# Patient Record
Sex: Female | Born: 1985 | Hispanic: Yes
Health system: Southern US, Community
[De-identification: ages and names within clinical notes are randomized; demographics above are authoritative.]

## PROBLEM LIST (undated history)

## (undated) ENCOUNTER — Inpatient Hospital Stay (HOSPITAL_COMMUNITY): Payer: Self-pay

## (undated) DIAGNOSIS — Z789 Other specified health status: Secondary | ICD-10-CM

## (undated) HISTORY — PX: APPENDECTOMY: SHX54

---
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
## 2006-07-23 ENCOUNTER — Emergency Department (HOSPITAL_COMMUNITY): Admission: EM | Admit: 2006-07-23 | Discharge: 2006-07-23 | Payer: Self-pay | Admitting: Emergency Medicine

## 2006-09-26 LAB — CONVERTED CEMR LAB: Pap Smear: NORMAL

## 2006-10-06 ENCOUNTER — Ambulatory Visit: Payer: Self-pay | Admitting: Obstetrics & Gynecology

## 2006-10-27 ENCOUNTER — Ambulatory Visit: Payer: Self-pay | Admitting: Gynecology

## 2006-11-24 ENCOUNTER — Ambulatory Visit: Payer: Self-pay | Admitting: Obstetrics and Gynecology

## 2007-07-20 ENCOUNTER — Ambulatory Visit: Payer: Self-pay | Admitting: Nurse Practitioner

## 2007-07-20 DIAGNOSIS — K5900 Constipation, unspecified: Secondary | ICD-10-CM | POA: Insufficient documentation

## 2007-07-20 DIAGNOSIS — M545 Low back pain: Secondary | ICD-10-CM

## 2007-07-20 LAB — CONVERTED CEMR LAB
ALT: 23 units/L (ref 0–35)
AST: 18 units/L (ref 0–37)
BUN: 10 mg/dL (ref 6–23)
Basophils Absolute: 0 10*3/uL (ref 0.0–0.1)
Bilirubin Urine: NEGATIVE
CO2: 22 meq/L (ref 19–32)
Creatinine, Ser: 0.59 mg/dL (ref 0.40–1.20)
Eosinophils Absolute: 0.3 10*3/uL (ref 0.0–0.7)
Eosinophils Relative: 5 % (ref 0–5)
Lymphocytes Relative: 35 % (ref 12–46)
Lymphs Abs: 2 10*3/uL (ref 0.7–4.0)
MCHC: 30.9 g/dL (ref 30.0–36.0)
Nitrite: NEGATIVE
Platelets: 290 10*3/uL (ref 150–400)
Protein, U semiquant: NEGATIVE
RBC: 4.58 M/uL (ref 3.87–5.11)
RDW: 14.8 % (ref 11.5–15.5)
Specific Gravity, Urine: 1.02
Total Bilirubin: 0.3 mg/dL (ref 0.3–1.2)
WBC: 5.8 10*3/uL (ref 4.0–10.5)
pH: 6

## 2007-07-21 ENCOUNTER — Encounter (INDEPENDENT_AMBULATORY_CARE_PROVIDER_SITE_OTHER): Payer: Self-pay | Admitting: Nurse Practitioner

## 2007-07-22 ENCOUNTER — Ambulatory Visit: Payer: Self-pay | Admitting: *Deleted

## 2007-08-12 ENCOUNTER — Ambulatory Visit: Payer: Self-pay | Admitting: Nurse Practitioner

## 2007-08-12 DIAGNOSIS — R0602 Shortness of breath: Secondary | ICD-10-CM

## 2007-08-12 DIAGNOSIS — J309 Allergic rhinitis, unspecified: Secondary | ICD-10-CM | POA: Insufficient documentation

## 2007-08-12 LAB — CONVERTED CEMR LAB
Blood in Urine, dipstick: NEGATIVE
Protein, U semiquant: NEGATIVE
Urobilinogen, UA: 0.2
pH: 5.5

## 2007-08-22 ENCOUNTER — Ambulatory Visit (HOSPITAL_COMMUNITY): Admission: RE | Admit: 2007-08-22 | Discharge: 2007-08-22 | Payer: Self-pay | Admitting: Family Medicine

## 2007-08-29 ENCOUNTER — Telehealth (INDEPENDENT_AMBULATORY_CARE_PROVIDER_SITE_OTHER): Payer: Self-pay | Admitting: *Deleted

## 2007-09-14 ENCOUNTER — Ambulatory Visit: Payer: Self-pay | Admitting: Nurse Practitioner

## 2007-09-14 DIAGNOSIS — J029 Acute pharyngitis, unspecified: Secondary | ICD-10-CM

## 2007-09-16 ENCOUNTER — Encounter (INDEPENDENT_AMBULATORY_CARE_PROVIDER_SITE_OTHER): Payer: Self-pay | Admitting: Nurse Practitioner

## 2007-09-23 ENCOUNTER — Ambulatory Visit: Payer: Self-pay | Admitting: Internal Medicine

## 2007-09-23 DIAGNOSIS — F329 Major depressive disorder, single episode, unspecified: Secondary | ICD-10-CM | POA: Insufficient documentation

## 2007-09-23 DIAGNOSIS — F429 Obsessive-compulsive disorder, unspecified: Secondary | ICD-10-CM | POA: Insufficient documentation

## 2007-09-23 DIAGNOSIS — J019 Acute sinusitis, unspecified: Secondary | ICD-10-CM

## 2007-10-07 ENCOUNTER — Telehealth (INDEPENDENT_AMBULATORY_CARE_PROVIDER_SITE_OTHER): Payer: Self-pay | Admitting: *Deleted

## 2007-10-18 ENCOUNTER — Ambulatory Visit: Payer: Self-pay | Admitting: Internal Medicine

## 2007-10-18 DIAGNOSIS — B373 Candidiasis of vulva and vagina: Secondary | ICD-10-CM

## 2007-10-18 DIAGNOSIS — B3731 Acute candidiasis of vulva and vagina: Secondary | ICD-10-CM | POA: Insufficient documentation

## 2007-10-18 LAB — CONVERTED CEMR LAB: KOH Prep: NEGATIVE

## 2007-11-14 ENCOUNTER — Emergency Department (HOSPITAL_COMMUNITY): Admission: EM | Admit: 2007-11-14 | Discharge: 2007-11-15 | Payer: Self-pay | Admitting: Emergency Medicine

## 2008-03-08 ENCOUNTER — Emergency Department (HOSPITAL_COMMUNITY): Admission: EM | Admit: 2008-03-08 | Discharge: 2008-03-08 | Payer: Self-pay | Admitting: Emergency Medicine

## 2009-10-15 ENCOUNTER — Telehealth (INDEPENDENT_AMBULATORY_CARE_PROVIDER_SITE_OTHER): Payer: Self-pay | Admitting: Internal Medicine

## 2009-10-15 ENCOUNTER — Ambulatory Visit: Payer: Self-pay | Admitting: Internal Medicine

## 2009-10-16 ENCOUNTER — Encounter (INDEPENDENT_AMBULATORY_CARE_PROVIDER_SITE_OTHER): Payer: Self-pay | Admitting: Internal Medicine

## 2009-10-16 ENCOUNTER — Telehealth (INDEPENDENT_AMBULATORY_CARE_PROVIDER_SITE_OTHER): Payer: Self-pay | Admitting: Internal Medicine

## 2009-10-23 ENCOUNTER — Telehealth (INDEPENDENT_AMBULATORY_CARE_PROVIDER_SITE_OTHER): Payer: Self-pay | Admitting: Nurse Practitioner

## 2009-10-27 ENCOUNTER — Ambulatory Visit: Payer: Self-pay | Admitting: Nurse Practitioner

## 2009-10-27 ENCOUNTER — Inpatient Hospital Stay (HOSPITAL_COMMUNITY): Admission: AD | Admit: 2009-10-27 | Discharge: 2009-10-28 | Payer: Self-pay | Admitting: Family Medicine

## 2009-10-30 ENCOUNTER — Ambulatory Visit: Payer: Self-pay | Admitting: Nurse Practitioner

## 2009-10-31 ENCOUNTER — Encounter (INDEPENDENT_AMBULATORY_CARE_PROVIDER_SITE_OTHER): Payer: Self-pay | Admitting: Nurse Practitioner

## 2009-11-04 ENCOUNTER — Telehealth (INDEPENDENT_AMBULATORY_CARE_PROVIDER_SITE_OTHER): Payer: Self-pay | Admitting: Nurse Practitioner

## 2009-11-06 ENCOUNTER — Inpatient Hospital Stay (HOSPITAL_COMMUNITY): Admission: EM | Admit: 2009-11-06 | Discharge: 2009-11-11 | Payer: Self-pay | Admitting: Emergency Medicine

## 2009-11-07 ENCOUNTER — Ambulatory Visit: Payer: Self-pay | Admitting: Internal Medicine

## 2009-12-04 ENCOUNTER — Ambulatory Visit: Payer: Self-pay | Admitting: Nurse Practitioner

## 2009-12-04 DIAGNOSIS — K219 Gastro-esophageal reflux disease without esophagitis: Secondary | ICD-10-CM | POA: Insufficient documentation

## 2009-12-12 ENCOUNTER — Telehealth (INDEPENDENT_AMBULATORY_CARE_PROVIDER_SITE_OTHER): Payer: Self-pay | Admitting: Nurse Practitioner

## 2009-12-13 ENCOUNTER — Encounter (INDEPENDENT_AMBULATORY_CARE_PROVIDER_SITE_OTHER): Payer: Self-pay | Admitting: Internal Medicine

## 2009-12-18 ENCOUNTER — Ambulatory Visit: Payer: Self-pay | Admitting: Nurse Practitioner

## 2009-12-18 DIAGNOSIS — M549 Dorsalgia, unspecified: Secondary | ICD-10-CM | POA: Insufficient documentation

## 2009-12-24 ENCOUNTER — Telehealth (INDEPENDENT_AMBULATORY_CARE_PROVIDER_SITE_OTHER): Payer: Self-pay | Admitting: *Deleted

## 2010-01-23 ENCOUNTER — Ambulatory Visit: Payer: Self-pay | Admitting: Internal Medicine

## 2010-02-17 ENCOUNTER — Inpatient Hospital Stay (HOSPITAL_COMMUNITY): Admission: AD | Admit: 2010-02-17 | Discharge: 2010-02-18 | Payer: Self-pay | Admitting: Obstetrics & Gynecology

## 2010-02-17 ENCOUNTER — Ambulatory Visit: Payer: Self-pay | Admitting: Family

## 2010-02-21 ENCOUNTER — Emergency Department (HOSPITAL_COMMUNITY): Admission: EM | Admit: 2010-02-21 | Discharge: 2010-02-21 | Payer: Self-pay | Admitting: Emergency Medicine

## 2010-02-25 ENCOUNTER — Telehealth (INDEPENDENT_AMBULATORY_CARE_PROVIDER_SITE_OTHER): Payer: Self-pay | Admitting: *Deleted

## 2010-05-27 NOTE — Progress Notes (Signed)
Summary: Stool results  Phone Note Call from Patient   Summary of Call: contact pt and see if diarrhea has resolved or at least decreased let her know that stool samples were ok. no bacteria  Initial call taken by: Lehman Prom FNP,  November 04, 2009 7:54 AM  Follow-up for Phone Call        pt informed of above information. Follow-up by: Levon Hedger,  November 04, 2009 12:12 PM

## 2010-05-27 NOTE — Progress Notes (Signed)
Summary: Back Pain  Phone Note Call from Patient Call back at Home Phone [phone] Call back at [phone]   Summary of Call: the pt is having pain  since last week on the right side of the upper section of her back.  Pt was looking for an appointment for today but unfortunately we do not have anything availablle. Mulberry MD Initial call taken by: Manon Hilding,  December 12, 2009 2:21 PM  Follow-up for Phone Call        Left message on answering machine for pt. to return call.  Dutch Quint RN  December 12, 2009 4:24 PM  Left message on answering machine for pt. to return call.  Dutch Quint RN  December 13, 2009 11:34 AM  Via interpreter, States feels like a needle in her right upper back; in the front, feels pain under her ribs.  Last rib hurts when she's laughing or when she takes a deep breath.  No trauma,  feels like her front and back are touching.  States she has an itchy throat; yesterday and day before she was sneezing a lot.  Denies cough or fever.  When she has a bowel movement, is passing bright red blood since yesterday, small amount. Denies knowledge of hemorrhoids.  Advised of no openings today, has appt. on 8/24.  If symptoms worsen significantly,  to go to urgent care.  Verbalized understanding. Follow-up by: Dutch Quint RN,  December 13, 2009 2:30 PM

## 2010-05-27 NOTE — Progress Notes (Signed)
Summary: PROBLEM WITH ITCHING   Phone Note Call from Patient   Summary of Call: PATIENT IS CALLING TO SEE IF YOU CAN GINE SOME MEDICINE HE IS HAVING REAL BAD ITCHING ON HER ANUS SHE CAN'T SLEEP [facility] AND SHE SAID THEY CAN'T FIND WHAT IT IS. PLEASE CALL HER AT [phone] SHE HAS APP TO SEE YOU ON 12.01. AT 2:45 PM Initial call taken by: Domenic Polite,  February 25, 2010 1:54 PM  Follow-up for Phone Call        This should go to CMA to obtain more information please. Has she been on antibiotics?  Any vaginal discharge?  Any redness of area?  Diarrhea recently?  What preceded the itching?   If we have a cancellation, try to work her in as well. Follow-up by: Julieanne Manson MD,  February 27, 2010 2:30 PM  Additional Follow-up for Phone Call Additional follow up Details #1::        pt says she went to [facility] and they referred her to a dermatologist and the derma gave her a cream and pt has a f/u on wed. Additional Follow-up by: Arm[place] Shannon,  February 28, 2010 8:43 AM

## 2010-05-27 NOTE — Progress Notes (Signed)
Summary: MEDICATION MAKE HEART BEAT FAST  Phone Note Call from Patient Call back at Home Phone [phone]   Summary of Call: MULBERRY PT. Shelby Morales SAYS THAT THE MEDICATION THAT SHE TAKES DEXELANT IS CAUSING HER HERAT TO BEAT FAST, HER BACK HAS A BURNING SENSATION AND SHE STOPPED TAKING IT 2 DAYS AGO. SHE HAS ONLY HAD 2 DOSES OF THIS MED. Initial call taken by: Leodis Rains,  December 24, 2009 3:14 PM  Follow-up for Phone Call        Sent to E. Mulberry.  Dutch Quint RN  December 24, 2009 3:20 PM   Additional Follow-up for Phone Call Additional follow up Details #1::        Stop the Dexilant Will call in another medication--generic for pepcid--to take daily at bedtime Additional Follow-up by: Julieanne Manson MD,  December 25, 2009 8:43 AM    Additional Follow-up for Phone Call Additional follow up Details #2::    Left message with pt.'s husband for pt. to return call.  Dutch Quint RN  December 25, 2009 10:46 AM  Pt. at work -- pt. husband has appt. today, will bring wife with him to get information.  Dutch Quint RN  December 26, 2009 12:05 PM  I spoke with pt today and she is aware of the change of medicine   Follow-up by: Cheryll Dessert,  December 31, 2009 8:48 AM  New/Updated Medications: FAMOTIDINE 40 MG TABS (FAMOTIDINE) 1 tab by mouth at bedtime Prescriptions: FAMOTIDINE 40 MG TABS (FAMOTIDINE) 1 tab by mouth at bedtime  #30 x 2   Entered and Authorized by:   Julieanne Manson MD   Signed by:   Julieanne Manson MD on 12/25/2009   Method used:   Faxed to ...       [facility] (retail)       [address] [place], [place]  [address]       Ph: [phone]       Fax: [phone]   RxID:   [id number]

## 2010-05-27 NOTE — Letter (Signed)
Summary: Handout Printed  Printed Handout:  - Gastroesophageal Reflux Disease (GERD) 

## 2010-05-27 NOTE — Assessment & Plan Note (Signed)
Summary: Back pain   Vital Signs:  Patient profile:   25 year old female LMP:     11/20/2009 Weight:      163.8 pounds Temp:     97.8112 degrees F oral Pulse rate:   71 / minute Pulse rhythm:   regular Resp:     16 per minute BP sitting:   112 / 68  (left arm) Cuff size:   regular  Vitals Entered By: Levon Hedger (December 18, 2009 11:13 AM) CC: having alot of pain in middle of back she feels pressure in that area...2 weeks ago she had more pain in the right side of her back...she says that she has problem when she makes a bowel movement Is Patient Diabetic? No Pain Assessment Patient in pain? yes     Location: back Intensity: 5-6  Does patient need assistance? Functional Status Self care Ambulation Normal LMP (date): 11/20/2009 LMP - Character: normal     Enter LMP: 11/20/2009 Last PAP Result historical - normal   CC:  having alot of pain in middle of back she feels pressure in that area...2 weeks ago she had more pain in the right side of her back...she says that she has problem when she makes a bowel movement.  History of Present Illness:  Pt into the office for pain in the middle of her back present for the past 2 weeks Pain has been increasing over the past 2 weeks. currently feels like someone has a needle and is sticking inside of the middle part of her back Pt used some "hot cream" which alleviated the pain but it returned again the next day.  She only used the cream one time. no other over the counter meds for the pain  Pt recalls that on the days when she had the pain stronger she took clothes to the laundry. She also moved about 1.5 months ago to a new apartment.  In house interpreter present to speak Spanish  **Pt called to the front desk while in the room and asked how long it would be before provider comes into the room to see her** Pt also has lots of questions about her exposure to mold and if this will be an ongoing problem for her breathng in the  future.  Again, pt has since moved from the environment after living there for 5 months.    Allergies (verified): 1)  ! Asa 2)  ! Ibuprofen  Review of Systems CV:  Denies chest pain or discomfort. Resp:  Denies cough. GI:  Complains of nausea; denies abdominal pain, constipation, and vomiting; BM now about 3 times per  c/o nausea at time when she eats.  pt was prescribed ranitidine during last visit but pt is not taking as ordered.  She reports that the medication made her sick so she is not taking.  Gallbladder is still in place.. MS:  Complains of mid back pain.  Physical Exam  General:  alert.   Head:  normocephalic.   Lungs:  normal breath sounds.   Heart:  normal rate and regular rhythm.   Abdomen:  normal bowel sounds.   Msk:  normal ROM.   Neurologic:  alert & oriented X3.   Skin:  color normal.   Psych:  Oriented X3.     Impression & Recommendations:  Problem # 1:  BACK PAIN (ICD-724.5) ? due to muscle strain from moving or ? gallbladder problems as this is still in place will advise pt to use topical cream  for back - no oral meds except tylenol as this may irritate stomach.  Problem # 2:  GERD (ICD-530.81) pt did NOT take ranitidine as ordered will give samples of dexilant and advised pt to take DAILY The following medications were removed from the medication list:    Ranitidine Hcl 150 Mg Tabs (Ranitidine hcl) ..... One tablet by mouth two times a day for stomach Her updated medication list for this problem includes:    Dexilant 60 Mg Cpdr (Dexlansoprazole) ..... One tablet daily before breakfast  Complete Medication List: 1)  Flora-q Caps (Probiotic product) .... One tablet by mouth daily 2)  Dexilant 60 Mg Cpdr (Dexlansoprazole) .... One tablet daily before breakfast  Anticoagulation Management Assessment/Plan:            Patient Instructions: 1)  Follow up in 2 weeks with Mulberry for back and stomach problems 2)  Take medications DAILY  before  breakfast Prescriptions: DEXILANT 60 MG CPDR (DEXLANSOPRAZOLE) One tablet daily before breakfast  #15 x 0   Entered and Authorized by:   Lehman Prom FNP   Signed by:   Lehman Prom FNP on 12/18/2009   Method used:   Samples Given   RxID:   [id number]

## 2010-05-27 NOTE — Progress Notes (Signed)
Summary: Side Effect from the medication/ Please call her back  Phone Note Call from Patient Call back at [phone]   Summary of Call: The pt start having side effects from the medication (Azithromycin)  (diarrhea for two days, itching rectum area), loss appetite and throat itching too, the pt also has blood spots in both eyes.   Pt is wondering if should she continue  taking the medication (pt still has one pending pill).  [facility]) Delrae Alfred MD Initial call taken by: Manon Hilding,  October 23, 2009 9:37 AM  Follow-up for Phone Call        Left message on answering machine for pt to return call at [phone]. Follow-up by: Vesta Mixer CMA,  October 23, 2009 4:28 PM  Additional Follow-up for Phone Call Additional follow up Details #1::        Pt returned called and said she has taken the antibx with food.  She has not yet taken the last pill.  Her symptoms started Monday and she started the antibx Friday.  She is having diarrhea, itchy throat, cough and some nausea. Additional Follow-up by: Vesta Mixer CMA,  October 23, 2009 4:33 PM    Additional Follow-up for Phone Call Additional follow up Details #2::    The night before yesterday, the pt took imodium which help her to stop the diarrhea but now she has abdominal pain.  The pt have not been eating anything so her stomach is empty because she doesn't feel hungry.  The pt feels very weak; hardly for her keep standing  up.[facility] or [employer]. Manon Hilding  October 25, 2009 12:01 PM  Do not take any more of the medicine.  If she is still having abdominal problems/diarrhea next week to call and get seen at beginning of week.  Follow-up by: Julieanne Manson MD,  October 25, 2009 2:24 PM  Additional Follow-up for Phone Call Additional follow up Details #3:: Details for Additional Follow-up Action Taken: Pt did not receive the above information pt went to the ER on 10/27/2009 here today for f/u visit Additional  Follow-up by: Lehman Prom FNP,  October 30, 2009 11:29 AM

## 2010-05-27 NOTE — Assessment & Plan Note (Signed)
Summary: Acute - Diarrhea   Vital Signs:  Patient profile:   25 year old female LMP:     10/21/2009 Weight:      163.7 pounds BMI:     26.32 BSA:     1.84 Temp:     98.0 degrees F oral Pulse rhythm:   regular BP sitting:   102 / 70  (left arm) Cuff size:   regular  Vitals Entered By: Levon Hedger (October 30, 2009 10:36 AM) CC: stomach pain...leg numbness and weakness, Diarrhea Is Patient Diabetic? No Pain Assessment Patient in pain? yes     Location: stomach Intensity: 8-9  Does patient need assistance? Functional Status Self care Ambulation Normal LMP (date): 10/21/2009 LMP - Character: normal     Enter LMP: 10/21/2009 Last PAP Result historical - normal   CC:  stomach pain...leg numbness and weakness and Diarrhea.  History of Present Illness:  Pt into the office to f/u on a ER visit on 10/27/2009 (full Er visit reviewed) Dx: Gastroenteritis; pt was given IV hydration and antimetics U/A normal CMET normal CBC ok wet prep normal  Diarrhea      This is a 25 year old woman who presents with Diarrhea.  The symptoms began 1 week ago.  The intensity is described as moderate.  Pt was started on antibiotics following her last here with Dr. Delrae Alfred.  She took the antibiotics for 4 days before diarrhea a started (she has 1 pill remaining).  The patient complains of watery/unformed stools, but denies blood in stool.  Associated symptoms include abdominal pain and nausea.  2 loose stools today before visit in this office. The patient denies fever and vomiting.  The symptoms are worse with any food.  Patient's risk factors for diarrhea include recent antibiotic use.   Husband with abdominal pain but no diarrhea Toddler child with no GI symptoms Denies any recent travel Pt took imodium for GI symptoms without resolution. 2 pounds weight loss since checked here last on 10/15/2009 Pt has ranitadine 150mg  by mouth that she has been taking twice per day from the ER. She also has  phenergan that she is taking as needed (makes her have headache) She has been drinking liquids including Gatorade, Mt. dew, and water  Spanish interpreter present in office today with pt   Allergies (verified): 1)  ! Asa  Review of Systems General:  Denies fever. CV:  Denies chest pain or discomfort. Resp:  Denies cough. GI:  Complains of diarrhea; denies vomiting.  Physical Exam  General:  alert.   Head:  normocephalic.   Lungs:  normal breath sounds.   Heart:  normal rate and regular rhythm.   Abdomen:  BS x 4 soft.   left upper quadrant tenderness   Impression & Recommendations:  Problem # 1:  GASTROENTERITIS (ICD-558.9)  advsied pt of bland diet will send containers for pt to collect stool and return to this office continue to take ranitidine will order lomotil as needed since symptoms have been ongoing for over 1  week no fever Orders: T-Clostridium difficile Toxin A/B ([id number]) T-Stool Culture ([id number])  Her updated medication list for this problem includes:    Lomotil 2.5-0.025 Mg Tabs (Diphenoxylate-atropine) ..... One tablet by mouth two times per day for diarrhea  Complete Medication List: 1)  Peri-colace 8.6-50 Mg Tabs (Sennosides-docusate sodium) .Marland Kitchen.. 1 tablet by mouth two times a day 2)  Fluticasone Propionate 50 Mcg/act Susp (Fluticasone propionate) .... 2 sprays each nostril daily 3)  Zoloft  50 Mg Tabs (Sertraline hcl) .Marland Kitchen.. 1 tab by mouth daily 4)  Zyrtec Allergy 10 Mg Tabs (Cetirizine hcl) .Marland Kitchen.. 1 tab by mouth daily 5)  Lomotil 2.5-0.025 Mg Tabs (Diphenoxylate-atropine) .... One tablet by mouth two times per day for diarrhea 6)  Ranitidine Hcl 150 Mg Tabs (Ranitidine hcl) .... One tablet by mouth two times a day  Patient Instructions: 1)  Bring stools samples back to this office so it can be tested for infection. 2)  You will be notified of the results 3)  Take Ranitidine 150mg  by mouth two times a day  4)  May also take lomotil as needed for  diarrhea 5)  Bland diet - Bananas, Bread, rice, applesauce, chicken noodle soup, jello,  6)  Avoid spicy foods, tomato sauces, gravy and caffiene until diarrhea improves 7)  Follow up as needed if symptoms continue or worsen Prescriptions: LOMOTIL 2.5-0.025 MG TABS (DIPHENOXYLATE-ATROPINE) One tablet by mouth two times per day for diarrhea  #20 x 0   Entered and Authorized by:   Lehman Prom FNP   Signed by:   Lehman Prom FNP on 10/30/2009   Method used:   Print then Give to Patient   RxID:   [id number]

## 2010-05-27 NOTE — Letter (Signed)
Summary: Generic Letter  [facility]  [address] [place], [place] [address]   Phone: [phone]  Fax: [phone]    10/16/2009  [facility]    Re:  MYRA WENG [facility]      [address] [place], [place]  [address]  To Whom It May Concern:  Mr. Leonette Monarch and Ms. Fortino Sic are patients of our clinic who presented on 10/15/09 with symptoms of bloody noses, congestion, cough, sinus burning and shortness of breath/chest discomfort for the past month or so.  Their young daughter has also had a bloody nose recently as well.  They showed me pictures of their apartment with significant growth of mold on their bedroom carpet--generally associated with furniture placement, furniture, and baseboards.  Apparently, the wall at about 2 feet above the floor had mold growth behind a piece of furniture as well.  Certainly, this mold could be the source of their symptoms and I am concerned for their health.    Their landlord apparently has removed the carpet and was treating the floor beneath with an unknown chemical and the family is happy that that is being addressed.  The main concern at this point, however, is that the apartment be evaluated for mold in relation to the walls.  It is not clear to me or the family as to whether the mold was mainly related to the furniture and the carpet/floor or whether the wall could be more involved or not.  If the walls cannot be adequately assessed or addressed for this, the family is just asking whether they can break their lease without a fine  to find other accomodations.   Please let me know if you need further information to help in the situation          Sincerely,   Julieanne Manson MD

## 2010-05-27 NOTE — Assessment & Plan Note (Signed)
Summary: F/U ON PAIN IN BACK /LR   Vital Signs:  Patient profile:   25 year old female LMP:     01/20/2010 Weight:      161.4 pounds Temp:     98.8 degrees F oral Pulse rate:   84 / minute Pulse rhythm:   regular Resp:     16 per minute BP sitting:   110 / 76  (left arm)  Vitals Entered By: Michelle Nasuti (January 23, 2010 3:51 PM) CC: follow-up visit  LMP (date): 01/20/2010 LMP - Character: normal     Enter LMP: 01/20/2010 Last PAP Result historical - normal   CC:  follow-up visit .  History of Present Illness: 1.  Back pain:  Comes and goes for about 1 1/2 months now.  Still very painful at times.  See previous OV with Jesse Fall, FNP.  Describes a pressure pain inward from back, also describes as burning and sharp at times as well.  Was using just topicals for the pain.  After long discussion, pt. states started with left epigastric pain, burning in nature.  For this she was seen beginning of August.  Was started on Ranitidine, but appears she did not take appropriately.  2 weeks prior to her appt. end of August, began having the above back pain.  Was switched to Dexilant for 2 week course, but caused palpitations, so stopped.  Not clear if the 2  pains related.  Can have both discomforts at the same time, but has them separately as well--more often, they are separate pains.  Pain in the back is worse with sitting for long periods of time--better if leans forward.  With the abdominal pain, if takes Ranitidine, feels better.  Only using Ranitidine when her stomach hurts.  If eats red meat--makes her nauseated.  Stopped eating spicy foods as made pain worse.  Has not been using NSAIDS before onset of abdominal pain.  Not much in way of caffeine intake.  Not much alcohol intake.  Eats a lot of tomatoes and onions.  Gets abdominal pain if late with or misses a meal.  No change with BM, flatus.  No melena or hematochezia.  Sometimes, bright red blood on tissue paper only after a BM--only  after a hard or large stool.    Allergies: 1)  ! Asa 2)  ! Ibuprofen  Physical Exam  Lungs:  Normal respiratory effort, chest expands symmetrically. Lungs are clear to auscultation, no crackles or wheezes. Heart:  Normal rate and regular rhythm. S1 and S2 normal without gallop, murmur, click, rub or other extra sounds. Abdomen:  Tender in left and mid epigastric areas--mild to moderate.  No rebound or peritoneal signs.soft, normal bowel sounds, no masses, no hepatomegaly, and no splenomegaly.   Msk:  Tender over lower thoracic spinous processes and lumbar as well.  No definite paraspinous musculature tenderness. Neurologic:  strength normal in all extremities, gait normal, and DTRs symmetrical and normal.     Impression & Recommendations:  Problem # 1:  BACK PAIN (ICD-724.5) Avoid NSAIDS Orders: Diagnostic X-Ray/Fluoroscopy (Diagnostic X-Ray/Flu) Physical Therapy Referral (PT)  Problem # 2:  GERD (ICD-530.81) Vs. PUD. Strongly urged pt. to take Ranitidine two times a day every day for at least 2 months. Avoid tomatoes and onions until doing better. Her updated medication list for this problem includes:    Ranitidine Hcl 150 Mg Tabs (Ranitidine hcl) .Marland Kitchen... 1 tab by mouth two times a day  Complete Medication List: 1)  Flora-q Caps (  Probiotic product) .... One tablet by mouth daily 2)  Ranitidine Hcl 150 Mg Tabs (Ranitidine hcl) .Marland Kitchen.. 1 tab by mouth two times a day  Patient Instructions: 1)  Call if you do not hear from phyiscal therapy in 2 weeks. 2)  Follow up with Dr. Delrae Alfred in 2 months --GERD and back pain Prescriptions: RANITIDINE HCL 150 MG TABS (RANITIDINE HCL) 1 tab by mouth two times a day  #60 x 3   Entered and Authorized by:   Julieanne Manson MD   Signed by:   Julieanne Manson MD on 01/23/2010   Method used:   Electronically to        [facility] [phone]* (retail)       [address]       [place], [place]  [address]       Ph: [phone]       Fax:  [phone]   RxID:   [id number]

## 2010-05-27 NOTE — Progress Notes (Signed)
Summary: Urgent Message  Phone Note Call from Patient Call back at [phone]   Summary of Call: Although she had an appointment today she couldn't come today because of lack of transportation but she wanted to inform the provider that she is having back pain, crams and chest pain.  The pt has some issues with the appartment she is living because under her bed there is a large amount of moho.  Pt states that someone from the office of the apartments complex  came today to clean the moho but she had been living like this for almost  two weeks like this.  Although she reported on the same day they never came right away to clean the mess.   The pt is living with a child that is only three years old and her husband who also is a patient from here (Mr. Maralyn Sago) Delrae Alfred MD Initial call taken by: Manon Hilding,  October 15, 2009 9:37 AM  Follow-up for Phone Call        Having back pain burning sensation and chest pain, states when her back hurts her chest hurts. Coughing frequently expectorating mod amt. thick yellow-green mucous. S.O.B.@ intervals with activity. Denies fever. Missed appt this AM due to lack of transportation. Rescheduled for late morning. Follow-up by: Gaylyn Cheers RN,  October 15, 2009 10:57 AM

## 2010-05-27 NOTE — Assessment & Plan Note (Signed)
Summary: *LBP,Chest Pain,Cough//mm   Vital Signs:  Patient profile:   25 year old female Weight:      165 pounds BMI:     26.53 Temp:     98.2 degrees F Pulse rate:   79 / minute Pulse rhythm:   regular Resp:     20 per minute BP sitting:   109 / 59  (right arm) Cuff size:   regular  Vitals Entered By: Vesta Mixer CMA (October 15, 2009 11:55 AM) CC: has noticed a lot of mold around her bed and thinks that it is causing her some problems with her back and lungs.  Not taking any meds right now. Is Patient Diabetic? No Pain Assessment Patient in pain? yes     Location: back Intensity: 6  Does patient need assistance? Ambulation Normal   CC:  has noticed a lot of mold around her bed and thinks that it is causing her some problems with her back and lungs.  Not taking any meds right now..  History of Present Illness: Just saw pt's husband for concern for sinus symptoms, bloody nose and fungus infestation in apt.   Landlord not addressing as they would like--noted fungus on walls and carpet and secondary to delay in cleanup, pt. started cleaning.   Not clear when first noted--2 1/2 weeks or more ago.  Noted mainly on furniture and carpet under bed and furniture.  Also noted on baseboard.  Noted on wall higher than baseboard behind furniture.  Landlord has today pulled up carpet in the room involved and is treating floor and replacing with a new carpet.   Daughter has had a bloody nose--has been told it is from dryness.  Pt. complaining of the following symptoms for 3 1/2 weeks:   sore throat, mid back hurts and burns when takes a deep breath--always there, but worse with deep breath, joints and hands hurting.  Having headaches--top of head.  Does feel congested.   White nasal discharge.  Has not had a bloody nose.  No fever.  Allergies (verified): 1)  ! Asa  Physical Exam  General:  NAD Head:  Tender over frontal and maxillary sinus areas. Eyes:  No corneal or conjunctival  inflammation noted. EOMI. Perrla. Funduscopic exam benign, without hemorrhages, exudates or papilledema. Vision grossly normal. Ears:  External ear exam shows no significant lesions or deformities.  Otoscopic examination reveals clear canals, tympanic membranes are intact bilaterally without bulging, retraction, inflammation or discharge. Hearing is grossly normal bilaterally. Nose:  Mild erythema of nasal mucosa. Clear discharge Mouth:  throat with mild injection, no exudate Neck:  No deformities, masses, or tenderness noted. Chest Wall:  Tenderness over left thoracic paraspinous musculature Lungs:  Normal respiratory effort, chest expands symmetrically. Lungs are clear to auscultation, no crackles or wheezes. Heart:  Normal rate and regular rhythm. S1 and S2 normal without gallop, murmur, click, rub or other extra sounds. Abdomen:  Bowel sounds positive,abdomen soft and non-tender without masses, organomegaly or hernias noted.   Impression & Recommendations:  Problem # 1:  SINUSITIS, ACUTE (ICD-461.9) Bacterial vs.  hypersensitivity response to fungus in bedroom. Discussed with Dr. Ninetta Lights, ID--do not treat for fungal infection unless biopsy proven fungus in tissue--more likely a hypersensitivity reaction if related to fungus Based on photos, not clear if wall significantly involved or not Her updated medication list for this problem includes:    Fluticasone Propionate 50 Mcg/act Susp (Fluticasone propionate) .Marland Kitchen... 2 sprays each nostril daily    Azithromycin 250 Mg  Tabs (Azithromycin) .Marland Kitchen... 2 tabs by mouth today, then 1 tab daily for 4 more days  Complete Medication List: 1)  Peri-colace 8.6-50 Mg Tabs (Sennosides-docusate sodium) .Marland Kitchen.. 1 tablet by mouth two times a day 2)  Fluticasone Propionate 50 Mcg/act Susp (Fluticasone propionate) .... 2 sprays each nostril daily 3)  Zoloft 50 Mg Tabs (Sertraline hcl) .Marland Kitchen.. 1 tab by mouth daily 4)  Zyrtec Allergy 10 Mg Tabs (Cetirizine hcl) .Marland Kitchen.. 1 tab  by mouth daily 5)  Azithromycin 250 Mg Tabs (Azithromycin) .... 2 tabs by mouth today, then 1 tab daily for 4 more days  Patient Instructions: 1)  Call if you do not improve with change in carpet and cleaning.  2)  Saline nasal spray as needed. 3)  Discussed would write note to [facility] or Landlord if so desire regarding concern for fungus.  Family needs to decide if they would like to do this Prescriptions: AZITHROMYCIN 250 MG TABS (AZITHROMYCIN) 2 tabs by mouth today, then 1 tab daily for 4 more days  #1 pack x 0   Entered and Authorized by:   Julieanne Manson MD   Signed by:   Julieanne Manson MD on 10/15/2009   Method used:   Faxed to ...       [facility] (retail)       [address] [place], [place]  [address]       Ph: [phone]       Fax: [phone]   RxID:   [id number]   Appended Document: *LBP,Chest Pain,Cough//mm No obvious redness, swelling or synovial thickening of hand joints

## 2010-05-27 NOTE — Letter (Signed)
Summary: Handout Printed  Printed Handout:  - Gastroenteritis (Vomiting, Diarrhea & Dehydration)

## 2010-05-27 NOTE — Letter (Signed)
Summary: MAILED REQUESTED RECORDS TO [employer]  MAILED REQUESTED RECORDS TO [employer]   Imported By: Arta Bruce 12/13/2009 11:44:18  _____________________________________________________________________  External Attachment:    Type:   Image     Comment:   External Document

## 2010-05-27 NOTE — Assessment & Plan Note (Signed)
Summary: GERD   Vital Signs:  Patient profile:   25 year old female Weight:      160.9 pounds BMI:     25.87 Temp:     97.8 degrees F oral Pulse rate:   81 / minute Pulse rhythm:   regular Resp:     20 per minute BP sitting:   108 / 70  (left arm) Cuff size:   regular  Vitals Entered By: Levon Hedger (December 04, 2009 3:28 PM)  Nutrition Counseling: Patient's BMI is greater than 25 and therefore counseled on weight management options. CC: abdominal pain she said that she was in the hospital for stomach pain and now the pain has returned and is getting worse she says that it hurts more when she eats, Abdominal Pain Is Patient Diabetic? No Pain Assessment Patient in pain? no     Location: head  Does patient need assistance? Functional Status Self care Ambulation Normal   CC:  abdominal pain she said that she was in the hospital for stomach pain and now the pain has returned and is getting worse she says that it hurts more when she eats and Abdominal Pain.  History of Present Illness:  Pt into the office with c/o back pain and bilateral upper quad abdominal pain Pt went to the ER on 10/27/2009 witih similar abdominal pain. Pt was dx with Gastroenteris Pt was started on ranitidine 150mg  by mouth two times a day, which she finished 2 weeks ago. While taking the medications the abdominal pain and burning was better. Questioned pt about foods that make GERD worse and she responded: spicy foods avocado chocolate Pt denies any tobacco or tobacco use. Pt admits that she has been taking an over the counter pills that contained garlic Pt has also been taking forever living probiotics. Pt is very concerned that abdominal pain will increase as it did when she was seen in the hospital on 10/27/2009.  Diarrhea has resolved at this time  [employer] present today with pt - Spanish  Dyspepsia History:      She has no alarm features of dyspepsia including no history of melena,  hematochezia, dysphagia, persistent vomiting, or involuntary weight loss > 5%.  There is a prior history of GERD.  The patient does not have a prior history of documented ulcer disease.  The dominant symptom is heartburn or acid reflux.  An H-2 blocker medication is currently being taken.  She notes that the symptoms have improved with the H-2 blocker therapy.  Symptoms have not persisted after 4 weeks of H-2 blocker treatment.     Allergies (verified): 1)  ! Asa 2)  ! Ibuprofen  Review of Systems CV:  Denies chest pain or discomfort. Resp:  Denies cough. GI:  Complains of abdominal pain and indigestion.  Physical Exam  General:  alert.   Head:  normocephalic.   Lungs:  normal breath sounds.   Heart:  normal rate.   Abdomen:  left upper quad tenderness BS x 4 Msk:  normal ROM.   Neurologic:  alert & oriented X3.     Impression & Recommendations:  Problem # 1:  GERD (ICD-530.81) handout given spoke with pt in great detail regarding the dx and foods to avoid pt with questions like if she will be affected with this for the rest of her life Her updated medication list for this problem includes:    Ranitidine Hcl 150 Mg Tabs (Ranitidine hcl) ..... One tablet by mouth two times a  day for stomach  Complete Medication List: 1)  Ranitidine Hcl 150 Mg Tabs (Ranitidine hcl) .... One tablet by mouth two times a day for stomach 2)  Flora-q Caps (Probiotic product) .... One tablet by mouth daily  Dyspepsia Assessment/Plan:  Step Therapy: GERD Treatment Protocols:    Step-1: started    H-2 blocker chosen: Ranitidine 150mg  by mouth at bedtime  Patient Instructions: 1)  Probiotics - take over the counter [facility] brand 2)  Restart ranitadine 150mg  by mouth two times a day for stomach pain.   3)  Avoid foods that will cause irritation -  4)  Follow up as needed 5)  If symptoms persist she will need workup for possible colitis/diverticulitis Prescriptions: RANITIDINE HCL 150 MG TABS  (RANITIDINE HCL) One tablet by mouth two times a day for stomach  #60 x 5   Entered and Authorized by:   Lehman Prom FNP   Signed by:   Lehman Prom FNP on 12/04/2009   Method used:   Print then Give to Patient   RxID:   [id number]

## 2010-05-27 NOTE — Progress Notes (Signed)
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  So she can take this to the [employer].  She also wanted her husband name to be included (Mr. Maralyn Sago) [facility] MD Initial call taken by: Manon Hilding,  October 16, 2009 2:45 PM  Follow-up for Phone Call        Sent to Dr. Delrae Alfred.  Dutch Quint RN  October 16, 2009 4:51 PM   Additional Follow-up for Phone Call Additional follow up Details #1::        Letter written. May pick up Additional Follow-up by: Julieanne Manson MD,  October 16, 2009 7:34 PM    Additional Follow-up for Phone Call Additional follow up Details #2::    Mr. Leonette Monarch came today and picked up the letter.Manon Hilding  October 17, 2009 2:56 PM

## 2010-07-09 LAB — COMPREHENSIVE METABOLIC PANEL
ALT: 33 U/L (ref 0–35)
AST: 29 U/L (ref 0–37)
Albumin: 3.8 g/dL (ref 3.5–5.2)
BUN: 8 mg/dL (ref 6–23)
Chloride: 104 mEq/L (ref 96–112)
Creatinine, Ser: 0.68 mg/dL (ref 0.4–1.2)
GFR calc Af Amer: >60 mL/min (ref >60)
Sodium: 138 mEq/L (ref 135–145)
Total Bilirubin: 0.5 mg/dL (ref 0.3–1.2)
Total Protein: 6.9 g/dL (ref 6.0–8.3)

## 2010-07-09 LAB — HERPES SIMPLEX VIRUS CULTURE: Culture: NOT DETECTED

## 2010-07-09 LAB — WET PREP, GENITAL
Clue Cells Wet Prep HPF POC: NONE SEEN
Yeast Wet Prep HPF POC: NONE SEEN

## 2010-07-09 LAB — WOUND CULTURE: Gram Stain: NONE SEEN

## 2010-07-09 LAB — HSV 2 ANTIBODY, IGG: HSV 2 Glycoprotein G Ab, IgG: 1.68 IV — ABNORMAL HIGH

## 2010-07-09 LAB — CBC
MCV: 83.5 fL (ref 78.0–100.0)
RBC: 4.15 MIL/uL (ref 3.87–5.11)
WBC: 6.9 10*3/uL (ref 4.0–10.5)

## 2010-07-12 LAB — CBC
HCT: 31.8 % — ABNORMAL LOW (ref 36.0–46.0)
HCT: 33.3 % — ABNORMAL LOW (ref 36.0–46.0)
Hemoglobin: 10.6 g/dL — ABNORMAL LOW (ref 12.0–15.0)
Hemoglobin: 11.7 g/dL — ABNORMAL LOW (ref 12.0–15.0)
MCH: 27.5 pg (ref 26.0–34.0)
MCH: 27.7 pg (ref 26.0–34.0)
MCH: 27.9 pg (ref 26.0–34.0)
MCHC: 32.8 g/dL (ref 30.0–36.0)
MCV: 83.3 fL (ref 78.0–100.0)
MCV: 83.7 fL (ref 78.0–100.0)
Platelets: 147 10*3/uL — ABNORMAL LOW (ref 150–400)
RBC: 3.8 MIL/uL — ABNORMAL LOW (ref 3.87–5.11)
RBC: 4.1 MIL/uL (ref 3.87–5.11)
RBC: 4.26 MIL/uL (ref 3.87–5.11)
RDW: 14.6 % (ref 11.5–15.5)
WBC: 6.5 10*3/uL (ref 4.0–10.5)

## 2010-07-12 LAB — BASIC METABOLIC PANEL
BUN: 4 mg/dL — ABNORMAL LOW (ref 6–23)
CO2: 24 mEq/L (ref 19–32)
CO2: 25 mEq/L (ref 19–32)
Calcium: 8.5 mg/dL (ref 8.4–10.5)
Chloride: 105 mEq/L (ref 96–112)
Chloride: 106 mEq/L (ref 96–112)
Chloride: 109 mEq/L (ref 96–112)
GFR calc Af Amer: >60 mL/min (ref >60)
GFR calc Af Amer: >60 mL/min (ref >60)
GFR calc Af Amer: >60 mL/min (ref >60)
GFR calc non Af Amer: >60 mL/min (ref >60)
Glucose, Bld: 96 mg/dL (ref 70–99)
Glucose, Bld: 98 mg/dL (ref 70–99)
Potassium: 3 mEq/L — ABNORMAL LOW (ref 3.5–5.1)
Potassium: 3.5 mEq/L (ref 3.5–5.1)
Sodium: 136 mEq/L (ref 135–145)
Sodium: 136 mEq/L (ref 135–145)
Sodium: 137 mEq/L (ref 135–145)

## 2010-07-12 LAB — CLOSTRIDIUM DIFFICILE EIA

## 2010-07-13 LAB — COMPREHENSIVE METABOLIC PANEL
ALT: 18 U/L (ref 0–35)
AST: 22 U/L (ref 0–37)
AST: 24 U/L (ref 0–37)
Albumin: 3.8 g/dL (ref 3.5–5.2)
Albumin: 3.9 g/dL (ref 3.5–5.2)
Alkaline Phosphatase: 72 U/L (ref 39–117)
Alkaline Phosphatase: 83 U/L (ref 39–117)
CO2: 26 mEq/L (ref 19–32)
Calcium: 8.6 mg/dL (ref 8.4–10.5)
Chloride: 105 mEq/L (ref 96–112)
GFR calc Af Amer: >60 mL/min (ref >60)
GFR calc Af Amer: >60 mL/min (ref >60)
GFR calc non Af Amer: >60 mL/min (ref >60)
Potassium: 3.1 mEq/L — ABNORMAL LOW (ref 3.5–5.1)
Potassium: 3.3 mEq/L — ABNORMAL LOW (ref 3.5–5.1)
Sodium: 136 mEq/L (ref 135–145)
Total Bilirubin: 0.4 mg/dL (ref 0.3–1.2)
Total Protein: 7.2 g/dL (ref 6.0–8.3)

## 2010-07-13 LAB — STOOL CULTURE

## 2010-07-13 LAB — CBC
HCT: 37 % (ref 36.0–46.0)
Hemoglobin: 11.8 g/dL — ABNORMAL LOW (ref 12.0–15.0)
Hemoglobin: 12.6 g/dL (ref 12.0–15.0)
MCH: 27.9 pg (ref 26.0–34.0)
MCH: 27.9 pg (ref 26.0–34.0)
MCHC: 32.7 g/dL (ref 30.0–36.0)
MCV: 83.3 fL (ref 78.0–100.0)
Platelets: 153 10*3/uL (ref 150–400)
Platelets: 189 10*3/uL (ref 150–400)
Platelets: 194 10*3/uL (ref 150–400)
RBC: 4.22 MIL/uL (ref 3.87–5.11)
RBC: 4.51 MIL/uL (ref 3.87–5.11)
RDW: 14.8 % (ref 11.5–15.5)
WBC: 11.3 10*3/uL — ABNORMAL HIGH (ref 4.0–10.5)
WBC: 11.5 10*3/uL — ABNORMAL HIGH (ref 4.0–10.5)

## 2010-07-13 LAB — LIPASE, BLOOD: Lipase: 31 U/L (ref 11–59)

## 2010-07-13 LAB — URINALYSIS, ROUTINE W REFLEX MICROSCOPIC
Bilirubin Urine: NEGATIVE
Bilirubin Urine: NEGATIVE
Hgb urine dipstick: NEGATIVE
Ketones, ur: 15 mg/dL — AB
Nitrite: NEGATIVE
Protein, ur: NEGATIVE mg/dL
Specific Gravity, Urine: 1.016 (ref 1.005–1.030)
Specific Gravity, Urine: 1.025 (ref 1.005–1.030)
Urobilinogen, UA: 0.2 mg/dL (ref 0.0–1.0)
Urobilinogen, UA: 0.2 mg/dL (ref 0.0–1.0)
pH: 6 (ref 5.0–8.0)

## 2010-07-13 LAB — DIFFERENTIAL
Basophils Absolute: 0 10*3/uL (ref 0.0–0.1)
Basophils Relative: 0 % (ref 0–1)
Basophils Relative: 0 % (ref 0–1)
Eosinophils Absolute: 0 10*3/uL (ref 0.0–0.7)
Eosinophils Absolute: 0.2 10*3/uL (ref 0.0–0.7)
Eosinophils Relative: 0 % (ref 0–5)
Lymphs Abs: 2.9 10*3/uL (ref 0.7–4.0)
Monocytes Absolute: 0.6 10*3/uL (ref 0.1–1.0)
Monocytes Relative: 6 % (ref 3–12)
Neutro Abs: 4.9 10*3/uL (ref 1.7–7.7)
Neutrophils Relative %: 58 % (ref 43–77)

## 2010-07-13 LAB — GIARDIA/CRYPTOSPORIDIUM SCREEN(EIA): Cryptosporidium Screen (EIA): NEGATIVE

## 2010-07-13 LAB — URINE CULTURE

## 2010-07-13 LAB — CULTURE, BLOOD (ROUTINE X 2)

## 2010-07-13 LAB — FECAL LACTOFERRIN, QUANT: Fecal Lactoferrin: POSITIVE

## 2010-07-13 LAB — BASIC METABOLIC PANEL
BUN: 2 mg/dL — ABNORMAL LOW (ref 6–23)
Calcium: 8 mg/dL — ABNORMAL LOW (ref 8.4–10.5)
Creatinine, Ser: 0.71 mg/dL (ref 0.4–1.2)
GFR calc non Af Amer: >60 mL/min (ref >60)
Glucose, Bld: 102 mg/dL — ABNORMAL HIGH (ref 70–99)
Potassium: 2.8 mEq/L — ABNORMAL LOW (ref 3.5–5.1)

## 2010-07-13 LAB — GLUCOSE, CAPILLARY: Glucose-Capillary: 102 mg/dL — ABNORMAL HIGH (ref 70–99)

## 2010-07-13 LAB — CLOSTRIDIUM DIFFICILE EIA
C difficile Toxins A+B, EIA: 15
C difficile Toxins A+B, EIA: NEGATIVE

## 2010-07-13 LAB — WET PREP, GENITAL
Clue Cells Wet Prep HPF POC: NONE SEEN
Trich, Wet Prep: NONE SEEN

## 2010-07-13 LAB — POCT PREGNANCY, URINE: Preg Test, Ur: NEGATIVE

## 2010-09-09 NOTE — Group Therapy Note (Signed)
Shelby Morales, JARED NO.:  [id number]   MEDICAL RECORD NO.:  [id number]          PATIENT TYPE:  WOC   LOCATION:  [facility]                   FACILITY:  [facility]   PHYSICIAN:  Elsie Lincoln, MD      DATE OF BIRTH:  26-Jun-1985   DATE OF SERVICE:                                  CLINIC NOTE   CHIEF COMPLAINT:  Vaginal bleeding.   HISTORY OF PRESENT ILLNESS:  This is a 25 year old Hispanic female who  complains of irregular vaginal bleeding since delivery of her baby in  August 2007. She had regular menses until pregnancy at the age of 30.  Since delivery her periods have not been regular and she bleeds heavily  for 2 weeks, and then does not bleed for 2 weeks, and then bleeds again  for 2 weeks. She was initially on Depo-Provera for birth control after  the delivery of her baby and received her last injection about 6 months  ago. She currently is breast feeding and she and her partner are using  condoms for contraception. She has used oral contraceptive pills 1 month  but reports that she had a bad reaction and is not interested in ever  using oral contraceptive pills again. Since her last visit to clinic on  June 11th, she reports that she has not bleed over the past 2 weeks,  however did start yesterday with some cramping in her left lower  quadrant. At her last clinic visit she had a normal TSH, and GC, and CT  PCR probe. Records from [facility] were requested at her last  visit but are currently not in her chart for review.   VITALS: Temperature 97.6, heart rate 75, blood pressure 105/67, weight  is 135.9 pounds.   GENERAL: No acute distress, pleasant and cooperative throughout the  exam.   ABDOMEN: Thin, nontender, nondistended. No masses.   EXTREMITIES: No edema.   ASSESSMENT/PLAN:  This is a 25 year old Hispanic female with  dysfunctional uterine bleeding. We will check a CBC and bleeding time.  Her most recent hemoglobin was 13 in March  2008. We will also contact  [facility] once again in order to obtain previous records. She will return  to clinic for follow up in 4 weeks and will keep a bleeding diary in the  meantime, to include the number of pads or tampons she must use each  daily.           ______________________________  Elsie Lincoln, MD     KL/MEDQ  D:  10/27/2006  T:  10/28/2006  Job:  [id number]

## 2010-09-12 NOTE — H&P (Signed)
NAME:  Shelby Morales, Shelby Morales         ACCOUNT NO.:  [id number]   MEDICAL RECORD NO.:  [id number]          PATIENT TYPE:  INP   LOCATION:  9164                          [facility]ACILITY:  [facility]   PHYSICIAN:  Roseanna Rainbow, M.D.DATE OF BIRTH:  [birth year]   DATE OF ADMISSION:  12/14/2005  DATE OF DISCHARGE:                                HISTORY & PHYSICAL   CHIEF COMPLAINT:  The patient is a 25 year old gravida 1, para 0, with an  estimated date of confinement of December 14, 2005, with an intrauterine  pregnancy at 40 weeks complaining of uterine contractions.   HISTORY OF PRESENT ILLNESS:  The patient had been contracting for at least  12 hours prior to presentation.  She was examined earlier in the office  today; the cervix was 3 cm dilated.  She denies rupture of membranes.  However, she did report passing her mucous plug.   ALLERGIES:  No known drug allergies.   MEDICATIONS:  Prenatal vitamins.   OBSTETRIC HISTORY:  No risk factors.   PRENATAL LABORATORIES:  Hemoglobin 12, hematocrit 36.5, platelets 244,000.  Chlamydia/DNA probe negative.  GC probe negative.  Urine culture  sensitivity, no growth.  No uropathogens.  GBS negative on July 18.  One-  hour GTT 66.  HIV negative.  Blood type O positive, antibody screen  negative, rubella immune, sickle cell negative.  Pap smear normal.  RPR  nonreactive.   PAST GYNECOLOGICAL HISTORY:  Noncontributory.   PAST MEDICAL HISTORY:  Hepatitis.   PAST SURGICAL HISTORY:  Appendectomy.   SOCIAL HISTORY:  Married, living with her spouse  Unemployed.  Denies any  significant history of alcohol use.  She does have significant smoking  history.  Denies illicit drug use.   FAMILY HISTORY:  Adult onset diabetes, heart disease.   PHYSICAL EXAMINATION:  VITAL SIGNS:  Stable, afebrile.  GENERAL APPEARANCE:  Very uncomfortable.  ABDOMEN:  Gravid. Vertex -2.  Fetal heart tracing reassuring.  Tocodynamometer: contractions every 2-5 minutes.    ASSESSMENT:  Primigravida at term, early labor, fetal heart tracing  consistent with fetal well-being.   PLAN:  Admission, expected management.      Roseanna Rainbow, M.D.  Electronically Signed     LAJ/MEDQ  D:  12/14/2005  T:  12/14/2005  Job:  [id number]

## 2011-07-29 IMAGING — CT CT ABD-PELV W/ CM
2 of 4 series · 13 of 32 positions shown, 18 images · IV contrast (agent unspecified)
Comparison: Pelvic ultrasound performed 07/23/2006

CLINICAL DATA: Mid to left sided abdominal pain, nausea and
vomiting; headache.  Fever.

CT ABDOMEN AND PELVIS WITH CONTRAST
TECHNIQUE: Multidetector CT imaging of the abdomen and pelvis was
performed following the standard protocol during bolus
administration of intravenous contrast.
Contrast: The lungs are hyperexpanded, with flattening of the
hemidiaphragms, compatible with COPD.

[Series 2: routine abdomen · axial · 0.86mm/px · z∈[-431,-106]mm · 5 of 99 slices shown, 10 images]
[im 17/99  soft-tissue]
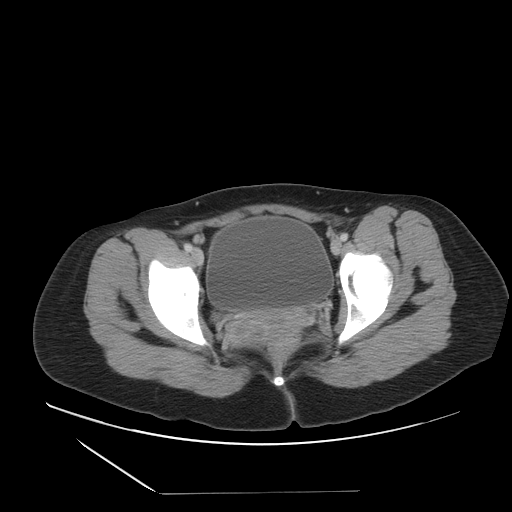
[im 17/99  bone]
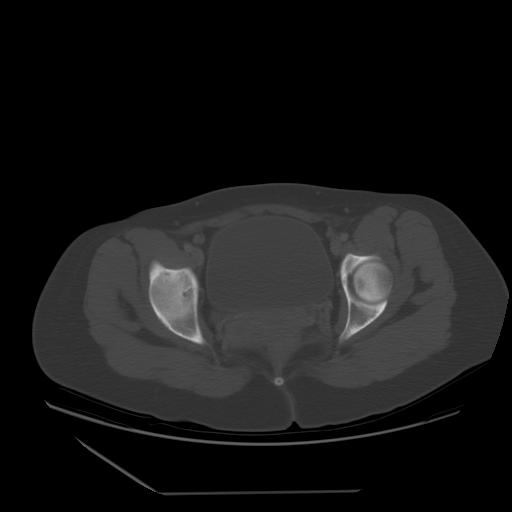
[im 33/99  soft-tissue]
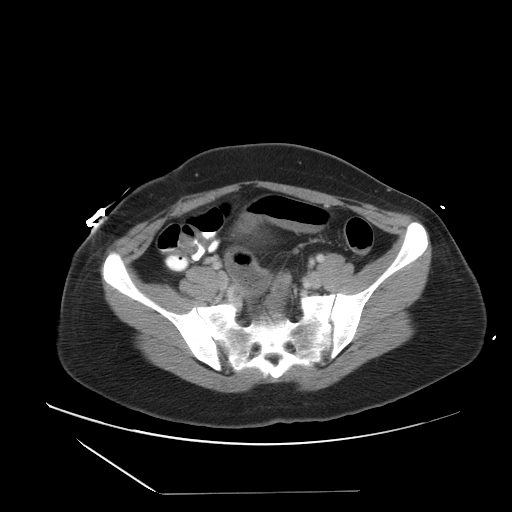
[im 33/99  lung]
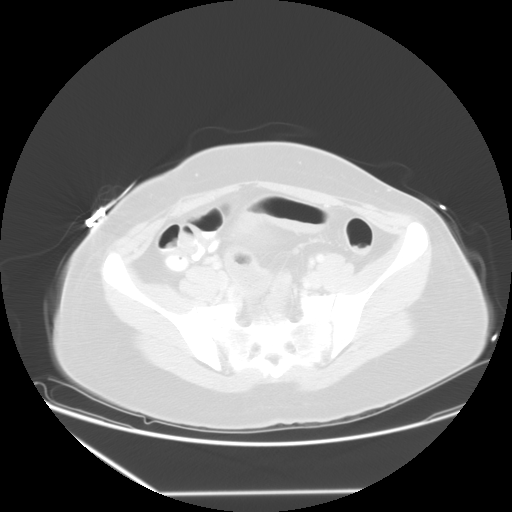
[im 50/99  soft-tissue]
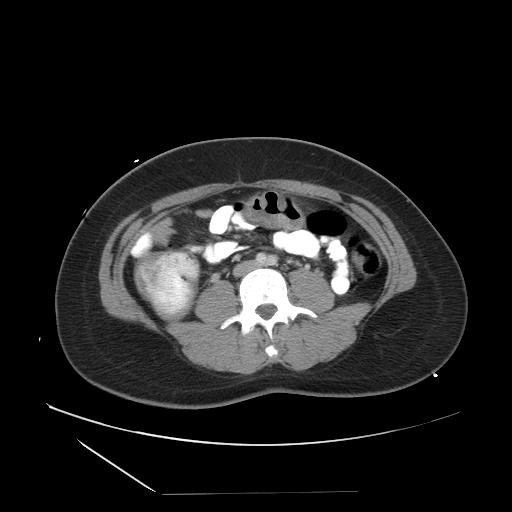
[im 50/99  lung]
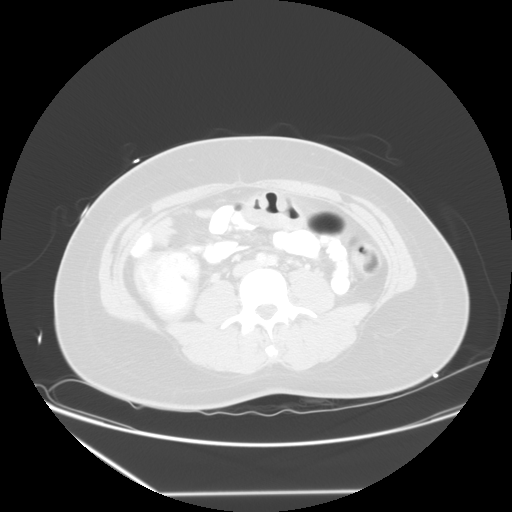
[im 66/99  soft-tissue]
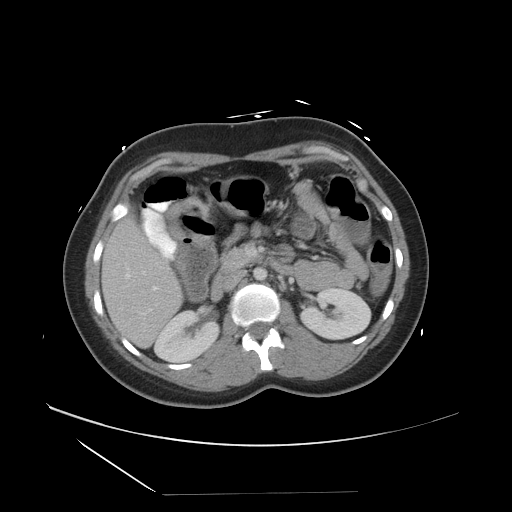
[im 66/99  lung]
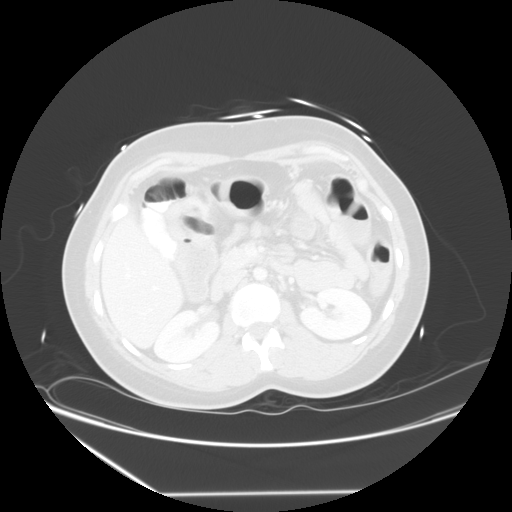
[im 82/99  soft-tissue]
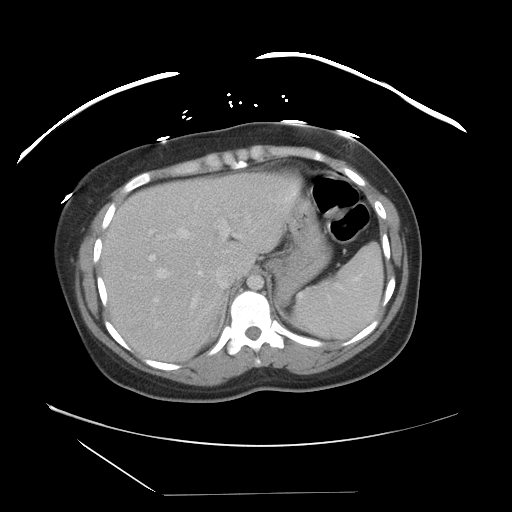
[im 82/99  lung]
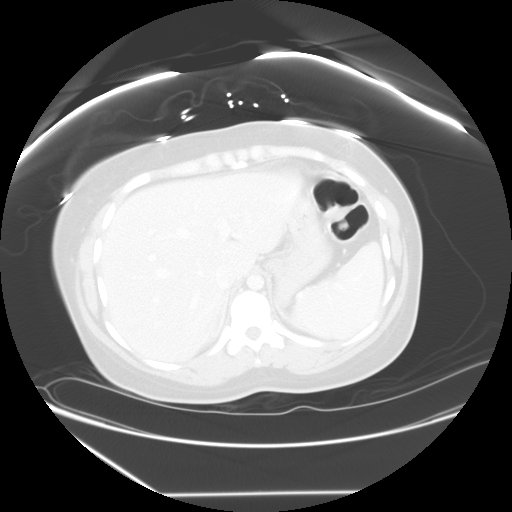

[Series 400: reformatted · sagittal · 0.98mm/px · 8 of 185 slices shown]
[im 16/185  soft-tissue]
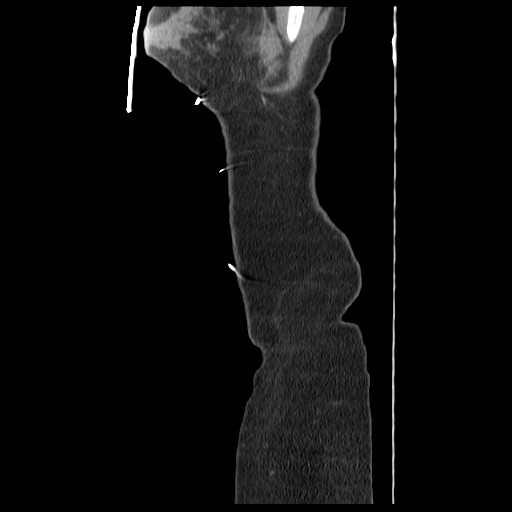
[im 47/185  soft-tissue]
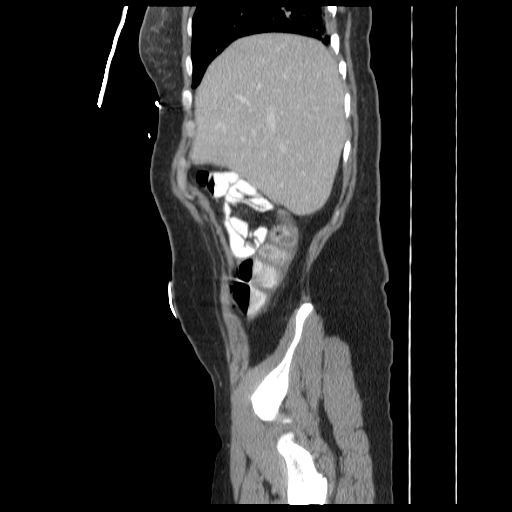
[im 62/185  soft-tissue]
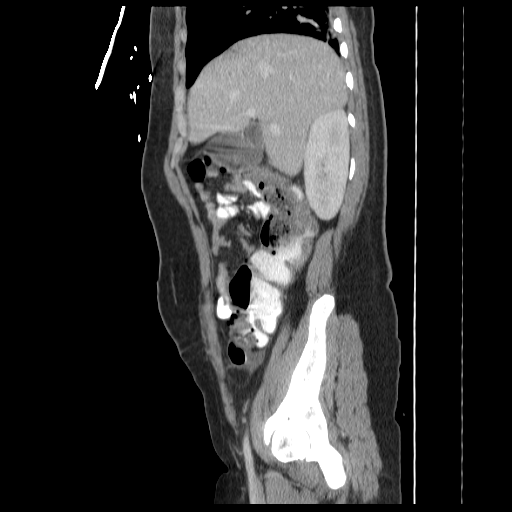
[im 77/185  soft-tissue]
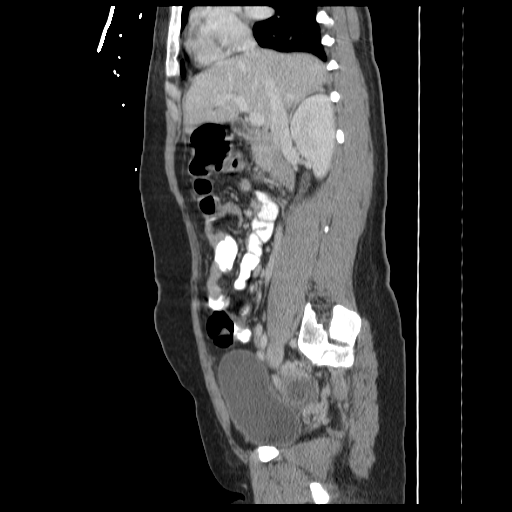
[im 108/185  soft-tissue]
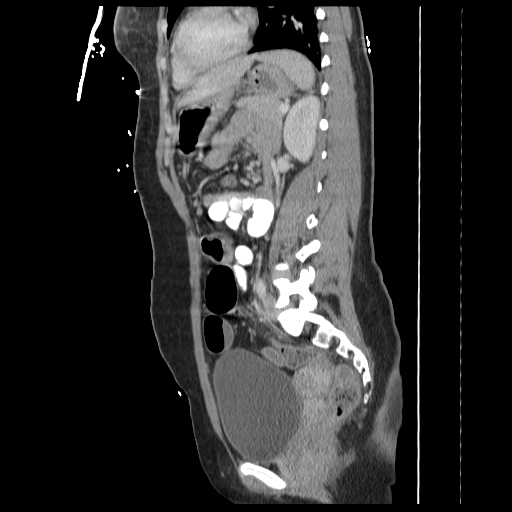
[im 123/185  soft-tissue]
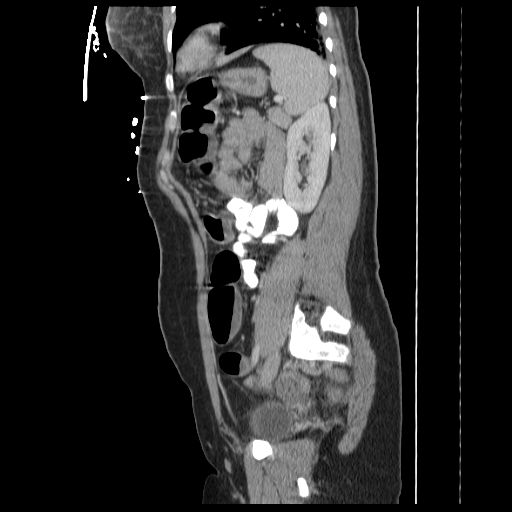
[im 139/185  soft-tissue]
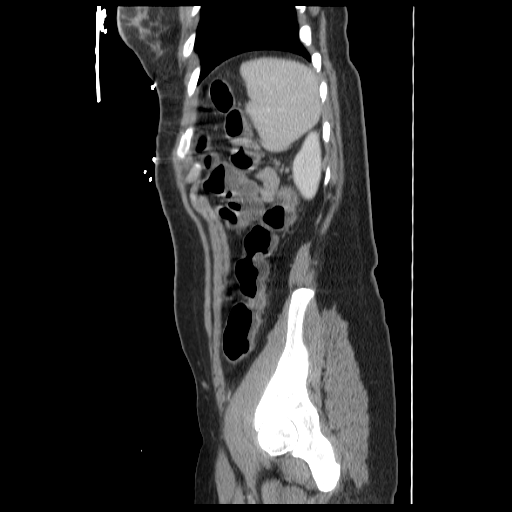
[im 169/185  soft-tissue]
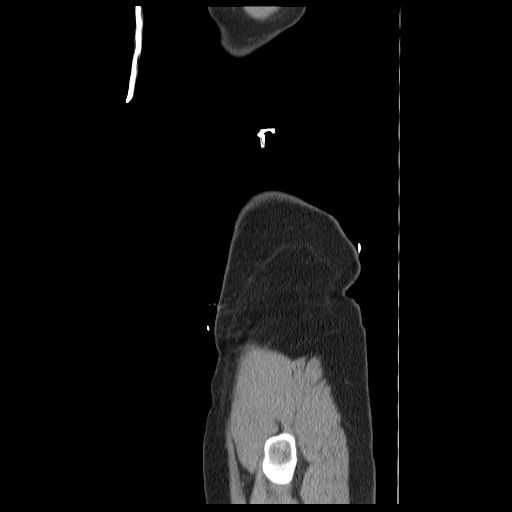

[13 of 32 positions shown; findings below may reference images not displayed]

FINDINGS: The visualized lung bases are clear.

The liver and spleen are unremarkable in appearance.  The
gallbladder is within normal limits.  The pancreas and adrenal
glands are unremarkable.  The kidneys are within normal limits
bilaterally; no hydronephrosis or perinephric stranding is seen.

The small bowel is unremarkable in appearance.  The stomach is
within normal limits.  No acute vascular abnormalities are seen.
Scattered small mesenteric nodes remain within normal limits.

The appendix is difficult to characterize, but several foci of air
adjacent to the cecum may reflect a normal appendix.  There is no
evidence for acute appendicitis.  Trace fluid is noted at the
inferior right paracolic gutter, without evidence of associated
soft tissue stranding.

There is mild focal mucosal thickening noted at the cecum; this may
be at the upper limits of normal, or could reflect a mild
infectious or inflammatory process.  The colon is unremarkable in
appearance.

The bladder is moderately distended and is within normal limits.
The uterus is grossly unremarkable.  The ovaries are within normal
limits; a 2.2 cm right ovarian cyst remains within physiologic
limits.  No inguinal lymphadenopathy is appreciated.  Trace free
fluid within the pelvis is likely physiologic in nature.

No acute osseous abnormalities are identified.
IMPRESSION: 1.  Mild focal mucosal thickening at the cecum; this could reflect
a mild infectious or inflammatory process.  The colon is
unremarkable in appearance.  Trace fluid noted at the inferior
right paracolic gutter, underlying this region.
2.  Otherwise unremarkable CT of the abdomen and pelvis.

## 2013-04-27 NOTE — L&D Delivery Note (Signed)
Delivery Note At 10:27 AM a viable and healthy female was delivered via Vaginal, Spontaneous Delivery (Presentation: Left Occiput Anterior).  APGAR: 9, 9; weight  .   Placenta status: Intact, Spontaneous.  Cord: 3 vessels with the following complications: None.   Anesthesia: Epidural  Episiotomy: None Lacerations: 2nd degree;Perineal Suture Repair: 3.0 vicryl All tissue approximated and brought together from second degree tear.   Est. Blood Loss (mL):    Mom to postpartum.  Baby to Couplet care / Skin to Skin.  Shelby Morales 04/13/2014, 11:14 AM  Attended delivery with Resident Agree with note Dr Gala Romney consulted re: perineal laceration, deemed to be 2nd degree Seabron Spates, CNM

## 2013-09-12 ENCOUNTER — Inpatient Hospital Stay (HOSPITAL_COMMUNITY): Payer: Self-pay

## 2013-09-12 ENCOUNTER — Inpatient Hospital Stay (HOSPITAL_COMMUNITY)
Admission: AD | Admit: 2013-09-12 | Discharge: 2013-09-12 | Disposition: A | Discharge status: Home / Self Care | Payer: Self-pay | Source: Ambulatory Visit | Attending: Family Medicine | Admitting: Family Medicine

## 2013-09-12 ENCOUNTER — Encounter (HOSPITAL_COMMUNITY): Payer: Self-pay | Admitting: *Deleted

## 2013-09-12 DIAGNOSIS — Z3490 Encounter for supervision of normal pregnancy, unspecified, unspecified trimester: Secondary | ICD-10-CM

## 2013-09-12 DIAGNOSIS — O26899 Other specified pregnancy related conditions, unspecified trimester: Secondary | ICD-10-CM

## 2013-09-12 DIAGNOSIS — O9989 Other specified diseases and conditions complicating pregnancy, childbirth and the puerperium: Secondary | ICD-10-CM

## 2013-09-12 DIAGNOSIS — O3410 Maternal care for benign tumor of corpus uteri, unspecified trimester: Secondary | ICD-10-CM | POA: Insufficient documentation

## 2013-09-12 DIAGNOSIS — R109 Unspecified abdominal pain: Secondary | ICD-10-CM | POA: Insufficient documentation

## 2013-09-12 DIAGNOSIS — D259 Leiomyoma of uterus, unspecified: Secondary | ICD-10-CM

## 2013-09-12 DIAGNOSIS — O99891 Other specified diseases and conditions complicating pregnancy: Secondary | ICD-10-CM | POA: Insufficient documentation

## 2013-09-12 HISTORY — DX: Other specified health status: Z78.9

## 2013-09-12 LAB — CBC
HCT: 31.3 % — ABNORMAL LOW (ref 36.0–46.0)
HEMOGLOBIN: 10.3 g/dL — AB (ref 12.0–15.0)
MCH: 27.2 pg (ref 26.0–34.0)
MCHC: 32.9 g/dL (ref 30.0–36.0)
MCV: 82.6 fL (ref 78.0–100.0)
PLATELETS: 182 10*3/uL (ref 150–400)
RBC: 3.79 MIL/uL — AB (ref 3.87–5.11)
RDW: 14.5 % (ref 11.5–15.5)
WBC: 5.8 10*3/uL (ref 4.0–10.5)

## 2013-09-12 LAB — URINALYSIS, ROUTINE W REFLEX MICROSCOPIC
BILIRUBIN URINE: NEGATIVE
GLUCOSE, UA: NEGATIVE mg/dL
Hgb urine dipstick: NEGATIVE
KETONES UR: NEGATIVE mg/dL
NITRITE: NEGATIVE
PH: 6.5 (ref 5.0–8.0)
Protein, ur: NEGATIVE mg/dL
SPECIFIC GRAVITY, URINE: 1.02 (ref 1.005–1.030)
Urobilinogen, UA: 0.2 mg/dL (ref 0.0–1.0)

## 2013-09-12 LAB — URINE MICROSCOPIC-ADD ON

## 2013-09-12 LAB — ABO/RH: ABO/RH(D): O POS

## 2013-09-12 LAB — WET PREP, GENITAL
Clue Cells Wet Prep HPF POC: NONE SEEN
Trich, Wet Prep: NONE SEEN
Yeast Wet Prep HPF POC: NONE SEEN

## 2013-09-12 LAB — POCT PREGNANCY, URINE: Preg Test, Ur: POSITIVE — AB

## 2013-09-12 LAB — HCG, QUANTITATIVE, PREGNANCY: hCG, Beta Chain, Quant, S: 97670 m[IU]/mL — ABNORMAL HIGH (ref <5)

## 2013-09-12 NOTE — MAU Provider Note (Signed)
Chief Complaint: Abdominal Pain  @MAUPATCONTACT @ SUBJECTIVE HPI: Shelby Morales is a 28 y.o. G2P1001 at [redacted]w[redacted]d by LMP who presents with cramping/lower abdominal pain x1 month. Pt describes left lower abdominal pain/cramping 9/10 that radiates to her chest and lower back for the past month. Pt says the pain feels better when she walks and gets worse when she sits or lays down for lengths of time. Pt denies any heavy lifting. Also denies vaginal bleeding and discharge, fever, dysuria, n/v/d, constipation   Past Medical History  Diagnosis Date  . Medical history non-contributory    OB History  Gravida Para Term Preterm AB SAB TAB Ectopic Multiple Living  2 1 1       1     # Outcome Date GA Lbr Len/2nd Weight Sex Delivery Anes PTL Lv  2 CUR           1 TRM              Past Surgical History  Procedure Laterality Date  . Appendectomy     History   Social History  . Marital Status: Married    Spouse Name: N/A    Number of Children: N/A  . Years of Education: N/A   Occupational History  . Not on file.   Social History Main Topics  . Smoking status: Never Smoker   . Smokeless tobacco: Not on file  . Alcohol Use: No  . Drug Use: No  . Sexual Activity: Yes   Other Topics Concern  . Not on file   Social History Narrative  . No narrative on file   No current facility-administered medications on file prior to encounter.   No current outpatient prescriptions on file prior to encounter.   Allergies  Allergen Reactions  . Aspirin Itching  . Morphine And Related     Reaction  Heart pain    ROS: Pertinent items in HPI  OBJECTIVE Blood pressure 119/71, pulse 76, temperature 98.7 F (37.1 C), temperature source Oral, resp. rate 18, height 5' 5.5" (1.664 m), weight 63.504 kg (140 lb), last menstrual period 07/14/2013. GENERAL: Well-developed, well-nourished female in no acute distress.  HEENT: Normocephalic HEART: normal rate RESP: normal effort ABDOMEN: Soft,  non-tender EXTREMITIES: Nontender, no edema NEURO: Alert and oriented Speculum exam: Vagina - Small amount of creamy discharge, no odor Cervix - No contact bleeding Bimanual exam: Cervix closed, no CMT,  Uterus non tender, normal size; enlarged  Adnexa non tender, no masses bilaterally GC/Chlam, wet prep done Chaperone present for exam.   LAB RESULTS Results for orders placed during the hospital encounter of 09/12/13 (from the past 24 hour(s))  URINALYSIS, ROUTINE W REFLEX MICROSCOPIC     Status: Abnormal   Collection Time    09/12/13 10:40 AM      Result Value Ref Range   Color, Urine YELLOW  YELLOW   APPearance HAZY (*) CLEAR   Specific Gravity, Urine 1.020  1.005 - 1.030   pH 6.5  5.0 - 8.0   Glucose, UA NEGATIVE  NEGATIVE mg/dL   Hgb urine dipstick NEGATIVE  NEGATIVE   Bilirubin Urine NEGATIVE  NEGATIVE   Ketones, ur NEGATIVE  NEGATIVE mg/dL   Protein, ur NEGATIVE  NEGATIVE mg/dL   Urobilinogen, UA 0.2  0.0 - 1.0 mg/dL   Nitrite NEGATIVE  NEGATIVE   Leukocytes, UA SMALL (*) NEGATIVE  URINE MICROSCOPIC-ADD ON     Status: Abnormal   Collection Time    09/12/13 10:40 AM  Result Value Ref Range   Squamous Epithelial / LPF FEW (*) RARE   WBC, UA 0-2  <3 WBC/hpf   RBC / HPF 0-2  <3 RBC/hpf   Bacteria, UA FEW (*) RARE   Urine-Other MUCOUS PRESENT    POCT PREGNANCY, URINE     Status: Abnormal   Collection Time    09/12/13 10:54 AM      Result Value Ref Range   Preg Test, Ur POSITIVE (*) NEGATIVE  CBC     Status: Abnormal   Collection Time    09/12/13 11:38 AM      Result Value Ref Range   WBC 5.8  4.0 - 10.5 K/uL   RBC 3.79 (*) 3.87 - 5.11 MIL/uL   Hemoglobin 10.3 (*) 12.0 - 15.0 g/dL   HCT 31.3 (*) 36.0 - 46.0 %   MCV 82.6  78.0 - 100.0 fL   MCH 27.2  26.0 - 34.0 pg   MCHC 32.9  30.0 - 36.0 g/dL   RDW 14.5  11.5 - 15.5 %   Platelets 182  150 - 400 K/uL  ABO/RH     Status: None   Collection Time    09/12/13 11:38 AM      Result Value Ref Range    ABO/RH(D) O POS    HCG, QUANTITATIVE, PREGNANCY     Status: Abnormal   Collection Time    09/12/13 11:39 AM      Result Value Ref Range   hCG, Beta Chain, Quant, [place] [address] (*) <5 mIU/mL  WET PREP, GENITAL     Status: Abnormal   Collection Time    09/12/13  1:00 PM      Result Value Ref Range   Yeast Wet Prep HPF POC NONE SEEN  NONE SEEN   Trich, Wet Prep NONE SEEN  NONE SEEN   Clue Cells Wet Prep HPF POC NONE SEEN  NONE SEEN   WBC, Wet Prep HPF POC FEW (*) NONE SEEN    IMAGING US Ob Comp Less 14 Wks  09/12/2013   CLINICAL DATA:  28 year old female with pelvic pain. Quantitative beta HCG 97,670. Estimated gestational age by LMP 8 weeks and 4 days. Initial encounter.  EXAM: OBSTETRIC <14 WK [place] AND TRANSVAGINAL OB US  TECHNIQUE: Both transabdominal and transvaginal ultrasound examinations were performed for complete evaluation of the gestation as well as the maternal uterus, adnexal regions, and pelvic cul-de-sac. Transvaginal technique was performed to assess early pregnancy.  COMPARISON:  CT Abdomen and Pelvis 11/06/2009.  FINDINGS: Intrauterine gestational sac: Single  Yolk sac:  Visible  Embryo:  Visible  Cardiac Activity: Detected  Heart Rate:  174 bpm  CRL:   2.6  mm   9 w 3 d                  [place] EDC: 12/19/5  Maternal uterus/adnexae: No subchorionic hemorrhage or pelvic free fluid. Both ovaries appear normal; corpus luteum cyst on the left (image 59). Urine fibroids identified measuring up to 2.7 cm  IMPRESSION: 1. Single living IUP demonstrated. No acute maternal findings visualized. 2. Uterine fibroids up to 2.7 cm.   Electronically Signed   By: Lars Pinks M.D.   On: 09/12/2013 14:40   US Ob Transvaginal  09/12/2013   CLINICAL DATA:  28 year old female with pelvic pain. Quantitative beta HCG 97,670. Estimated gestational age by LMP 8 weeks and 4 days. Initial encounter.  EXAM: OBSTETRIC <14 WK [place] AND TRANSVAGINAL OB US  TECHNIQUE: Both  transabdominal and transvaginal ultrasound examinations  were performed for complete evaluation of the gestation as well as the maternal uterus, adnexal regions, and pelvic cul-de-sac. Transvaginal technique was performed to assess early pregnancy.  COMPARISON:  CT Abdomen and Pelvis 11/06/2009.  FINDINGS: Intrauterine gestational sac: Single  Yolk sac:  Visible  Embryo:  Visible  Cardiac Activity: Detected  Heart Rate:  174 bpm  CRL:   2.6  mm   9 w 3 d                  [place] EDC: 12/19/5  Maternal uterus/adnexae: No subchorionic hemorrhage or pelvic free fluid. Both ovaries appear normal; corpus luteum cyst on the left (image 59). Urine fibroids identified measuring up to 2.7 cm  IMPRESSION: 1. Single living IUP demonstrated. No acute maternal findings visualized. 2. Uterine fibroids up to 2.7 cm.   Electronically Signed   By: Lars Pinks M.D.   On: 09/12/2013 14:40    MAU COURSE UA Urine pregnancy CBC HCG ABO/Rh GC/chlamydia Wet Prep Ultrasound-IUP confirmed  ASSESSMENT And Plan  Abdominal cramping associated with pregnancy 1. Discharge home 2. Alternate heat and ice to alleviate discomfort 3. Tylenol take as directed on the bottle  4. Any more severe abdominal pain associated with vaginal bleeding return to the MAU 5. Start prenatal care asap; HD information provided to the patient  Uterine fibroid       Medication List    Notice   You have not been prescribed any medications.       Melvern Banker, Student-PA 09/12/2013  12:18 PM   Evaluation and management procedures were performed by the PA student under my supervision and collaboration. I have reviewed the note and chart, and I agree with the management and plan.  Darrelyn Hillock Morris Markham, NP 09/12/2013 3:29 PM

## 2013-09-12 NOTE — MAU Provider Note (Signed)
Attestation of Attending Supervision of Advanced Practitioner (PA/CNM/NP): Evaluation and management procedures were performed by the Advanced Practitioner under my supervision and collaboration.  I have reviewed the Advanced Practitioner's note and chart, and I agree with the management and plan.  Lateefa Crosby S Carloyn Lahue, MD [facility] Faculty [facility] Attending 09/12/2013 4:04 PM   

## 2013-09-12 NOTE — Discharge Instructions (Signed)
Dolor abdominal en el embarazo  (Abdominal Pain During Pregnancy)  El dolor abdominal es frecuente durante el embarazo. Generalmente no causa ningún daño. El dolor abdominal puede tener numerosas causas. Algunas causas son más graves que otras. Ciertas causas de dolor abdominal durante el embarazo se diagnostican fácilmente. A veces, se tarda un tiempo para llegar al diagnóstico. Otras veces la causa no se conoce. El dolor abdominal puede estar relacionado con alguna alteración del embarazo, o puede deberse a una causa totalmente diferente. Por este motivo, siempre consulte a su médico cuando sienta molestias abdominales.  INSTRUCCIONES PARA EL CUIDADO EN EL HOGAR   Esté atenta al dolor para ver si hay cambios. Las siguientes indicaciones ayudarán a aliviar cualquier molestia que pueda sentir:  · No tenga relaciones sexuales y no coloque nada dentro de la vagina hasta que los síntomas hayan desaparecido completamente.  · Descanse todo lo que pueda hasta que el dolor se le haya calmado.  · Si siente náuseas, beba líquidos claros. Evite los alimentos sólidos mientras sienta malestar o tenga náuseas.  · Tome sólo medicamentos de venta libre o recetados, según las indicaciones del médico.  · Cumpla con todas las visitas de control, según le indique su médico.  SOLICITE ATENCIÓN MÉDICA DE INMEDIATO SI:  · Tiene un sangrado, pérdida de líquidos o elimina tejidos por la vagina.  · El dolor o los cólicos aumentan.  · Tiene vómitos persistentes.  · Comienza a sentir dolor al orinar u observa sangre.  · Tiene fiebre.  · Nota que los movimientos del bebé disminuyen.  · Siente intensa debilidad o se marea.  · Tiene dificultad para respirar con o sin dolor abdominal.  · Siente un dolor de cabeza intenso junto al dolor abdominal.  · Tiene una secreción vaginal anormal con dolor abdominal.  · Tiene diarrea persistente.  · El dolor abdominal sigue o empeora aún después de hacer reposo.  ASEGÚRESE DE QUE:   · Comprende estas  instrucciones.  · Controlará su afección.  · Recibirá ayuda de inmediato si no mejora o si empeora.  Document Released: 04/13/2005 Document Revised: 02/01/2013  ExitCare® Patient Information ©2014 [employer].

## 2013-09-12 NOTE — MAU Note (Signed)
Pt registered and waiting to go back to to MAU. Pt reports pain on K side of abdomen and spine that is worse at night. Warm pack given to pt to out on L abdomen.

## 2013-09-12 NOTE — MAU Note (Signed)
Pain for past month- at night- feels like contractions- across lower abd.  Sometimes into low back.  Right now pain is in LLQ.- comes and goes- feels like period pain. No bleeding.

## 2013-09-13 LAB — GC/CHLAMYDIA PROBE AMP
CT PROBE, AMP APTIMA: NEGATIVE
GC Probe RNA: NEGATIVE

## 2013-09-16 ENCOUNTER — Inpatient Hospital Stay (HOSPITAL_COMMUNITY)
Admission: AD | Admit: 2013-09-16 | Discharge: 2013-09-17 | Disposition: A | Discharge status: Home / Self Care | Payer: Medicaid Other | Source: Ambulatory Visit | Attending: Obstetrics & Gynecology | Admitting: Obstetrics & Gynecology

## 2013-09-16 ENCOUNTER — Encounter (HOSPITAL_COMMUNITY): Payer: Self-pay

## 2013-09-16 DIAGNOSIS — K219 Gastro-esophageal reflux disease without esophagitis: Secondary | ICD-10-CM

## 2013-09-16 DIAGNOSIS — O210 Mild hyperemesis gravidarum: Secondary | ICD-10-CM | POA: Insufficient documentation

## 2013-09-16 DIAGNOSIS — O219 Vomiting of pregnancy, unspecified: Secondary | ICD-10-CM

## 2013-09-16 DIAGNOSIS — O99891 Other specified diseases and conditions complicating pregnancy: Secondary | ICD-10-CM | POA: Insufficient documentation

## 2013-09-16 DIAGNOSIS — K5900 Constipation, unspecified: Secondary | ICD-10-CM | POA: Insufficient documentation

## 2013-09-16 DIAGNOSIS — O9989 Other specified diseases and conditions complicating pregnancy, childbirth and the puerperium: Secondary | ICD-10-CM

## 2013-09-16 DIAGNOSIS — R1032 Left lower quadrant pain: Secondary | ICD-10-CM | POA: Insufficient documentation

## 2013-09-16 LAB — URINALYSIS, ROUTINE W REFLEX MICROSCOPIC
Bilirubin Urine: NEGATIVE
Glucose, UA: NEGATIVE mg/dL
Hgb urine dipstick: NEGATIVE
Ketones, ur: 15 mg/dL — AB
LEUKOCYTES UA: NEGATIVE
Nitrite: NEGATIVE
PROTEIN: NEGATIVE mg/dL
UROBILINOGEN UA: 0.2 mg/dL (ref 0.0–1.0)
pH: 6 (ref 5.0–8.0)

## 2013-09-16 MED ORDER — PROMETHAZINE HCL 25 MG PO TABS
12.5000 mg | ORAL_TABLET | Freq: Once | ORAL | Status: AC
Start: 2013-09-17 — End: 2013-09-17
  Administered 2013-09-17: 12.5 mg via ORAL
  Filled 2013-09-16: qty 1

## 2013-09-16 NOTE — MAU Note (Signed)
I was here Weds and told i had fibroids. Started having abd pain yesterday on left side. Feels like something wants to open up inside like it is inflammed. Burning sensation that goes up to ribs. I get really tired walking and got dizzy today.

## 2013-09-17 DIAGNOSIS — K5900 Constipation, unspecified: Secondary | ICD-10-CM

## 2013-09-17 LAB — COMPREHENSIVE METABOLIC PANEL
ALBUMIN: 3.1 g/dL — AB (ref 3.5–5.2)
ALT: 10 U/L (ref 0–35)
AST: 13 U/L (ref 0–37)
Alkaline Phosphatase: 47 U/L (ref 39–117)
BUN: 9 mg/dL (ref 6–23)
CALCIUM: 8.7 mg/dL (ref 8.4–10.5)
CO2: 24 mEq/L (ref 19–32)
CREATININE: 0.59 mg/dL (ref 0.50–1.10)
Chloride: 102 mEq/L (ref 96–112)
GFR calc Af Amer: >90 mL/min (ref >90)
GFR calc non Af Amer: >90 mL/min (ref >90)
Glucose, Bld: 84 mg/dL (ref 70–99)
Potassium: 3.6 mEq/L — ABNORMAL LOW (ref 3.7–5.3)
Sodium: 138 mEq/L (ref 137–147)
TOTAL PROTEIN: 6.4 g/dL (ref 6.0–8.3)
Total Bilirubin: 0.2 mg/dL — ABNORMAL LOW (ref 0.3–1.2)

## 2013-09-17 LAB — CBC
HCT: 31 % — ABNORMAL LOW (ref 36.0–46.0)
Hemoglobin: 10.1 g/dL — ABNORMAL LOW (ref 12.0–15.0)
MCH: 26.8 pg (ref 26.0–34.0)
MCHC: 32.6 g/dL (ref 30.0–36.0)
MCV: 82.2 fL (ref 78.0–100.0)
PLATELETS: 183 10*3/uL (ref 150–400)
RBC: 3.77 MIL/uL — ABNORMAL LOW (ref 3.87–5.11)
RDW: 14.6 % (ref 11.5–15.5)
WBC: 6.7 10*3/uL (ref 4.0–10.5)

## 2013-09-17 MED ORDER — METOCLOPRAMIDE HCL 5 MG/ML IJ SOLN
10.0000 mg | Freq: Once | INTRAMUSCULAR | Status: AC
Start: 2013-09-17 — End: 2013-09-17
  Administered 2013-09-17: 10 mg via INTRAVENOUS
  Filled 2013-09-17: qty 2

## 2013-09-17 MED ORDER — FAMOTIDINE IN NACL 20-0.9 MG/50ML-% IV SOLN
20.0000 mg | Freq: Once | INTRAVENOUS | Status: AC
  Administered 2013-09-17: 20 mg via INTRAVENOUS
  Filled 2013-09-17: qty 50

## 2013-09-17 MED ORDER — BISACODYL 10 MG RE SUPP: 10.0000 mg | RECTAL | Status: DC | PRN

## 2013-09-17 MED ORDER — DEXAMETHASONE SODIUM PHOSPHATE 10 MG/ML IJ SOLN
10.0000 mg | Freq: Once | INTRAMUSCULAR | Status: AC
  Administered 2013-09-17: 10 mg via INTRAVENOUS
  Filled 2013-09-17: qty 1

## 2013-09-17 MED ORDER — POLYETHYLENE GLYCOL 3350 17 GM/SCOOP PO POWD: ORAL | Status: DC

## 2013-09-17 MED ORDER — PROMETHAZINE HCL 12.5 MG PO TABS: 12.5000 mg | ORAL_TABLET | Freq: Four times a day (QID) | ORAL | Status: DC | PRN

## 2013-09-17 MED ORDER — DIPHENHYDRAMINE HCL 50 MG/ML IJ SOLN
25.0000 mg | Freq: Once | INTRAMUSCULAR | Status: AC
  Administered 2013-09-17: 25 mg via INTRAVENOUS
  Filled 2013-09-17: qty 1

## 2013-09-17 MED ORDER — SODIUM CHLORIDE 0.9 % IV SOLN: INTRAVENOUS | Status: AC

## 2013-09-17 NOTE — MAU Provider Note (Signed)
History     CSN: [id number]  Arrival date and time: 09/16/13 2234   First Provider Initiated Contact with Patient 09/17/13 0040      Chief Complaint  Patient presents with  . Abdominal Pain   HPI [name] is a 28 y.o. G2P1001 at [redacted]w[redacted]d who presents to MAU with LLQ pain. The patient was seen in MAU on 09/12/13 with the same pain and had US showing normal appearing IUP. She states that the pain has been worse since last night. She denies vaginal bleeding or change in discharge. She also states that N/V has been worse since yesterday. She has had no PO intake since yesterday. She also endorses headache. She rates headache pain at 10/10 with sharp pains and blurred vision. She also feels dizzy and lightheaded, but denies LOC.   OB History   Grav Para Term Preterm Abortions TAB SAB Ect Mult Living   2 1 1       1       Past Medical History  Diagnosis Date  . Medical history non-contributory     Past Surgical History  Procedure Laterality Date  . Appendectomy      Family History  Problem Relation Age of Onset  . Hypertension Mother     History  Substance Use Topics  . Smoking status: Never Smoker   . Smokeless tobacco: Never Used  . Alcohol Use: No    Allergies:  Allergies  Allergen Reactions  . Aspirin Itching  . Morphine And Related     Reaction  Heart pain  . Tylenol [Acetaminophen] Diarrhea    Abd pain and headache also    Prescriptions prior to admission  Medication Sig Dispense Refill  . Prenatal Vit-Fe Fumarate-FA (PRENATAL MULTIVITAMIN) TABS tablet Take 1 tablet by mouth daily at 12 noon.        Review of Systems  Constitutional: Negative for fever and malaise/fatigue.  Gastrointestinal: Positive for nausea, vomiting and abdominal pain. Negative for diarrhea and constipation.  Genitourinary: Negative for dysuria, urgency and frequency.       Neg - vaginal bleeding, abnormal discharge  Neurological: Positive for dizziness and weakness. Negative  for loss of consciousness.   Physical Exam   Blood pressure 104/62, pulse 69, temperature 98.6 F (37 C), temperature source Oral, resp. rate 18, height 5\' 6"  (1.676 m), weight 135 lb 9.6 oz (61.508 kg), last menstrual period 07/14/2013, SpO2 100.00%.  Physical Exam  Constitutional: She is oriented to person, place, and time. She appears well-developed and well-nourished. No distress.  HENT:  Head: Normocephalic and atraumatic.  Cardiovascular: Normal rate.   Respiratory: Effort normal.  GI: Soft. Bowel sounds are normal. She exhibits no distension and no mass. There is tenderness (moderate tenderness to palpation of the LLQ). There is guarding. There is no rebound.  Neurological: She is alert and oriented to person, place, and time.  Skin: Skin is warm and dry. No erythema.  Psychiatric: She has a normal mood and affect.    Results for orders placed during the hospital encounter of 09/16/13 (from the past 24 hour(s))  URINALYSIS, ROUTINE W REFLEX MICROSCOPIC     Status: Abnormal   Collection Time    09/16/13 10:53 PM      Result Value Ref Range   Color, Urine YELLOW  YELLOW   APPearance CLEAR  CLEAR   Specific Gravity, Urine >1.030 (*) 1.005 - 1.030   pH 6.0  5.0 - 8.0   Glucose, UA NEGATIVE  NEGATIVE mg/dL  Hgb urine dipstick NEGATIVE  NEGATIVE   Bilirubin Urine NEGATIVE  NEGATIVE   Ketones, ur 15 (*) NEGATIVE mg/dL   Protein, ur NEGATIVE  NEGATIVE mg/dL   Urobilinogen, UA 0.2  0.0 - 1.0 mg/dL   Nitrite NEGATIVE  NEGATIVE   Leukocytes, UA NEGATIVE  NEGATIVE  CBC     Status: Abnormal   Collection Time    09/17/13  1:05 AM      Result Value Ref Range   WBC 6.7  4.0 - 10.5 K/uL   RBC 3.77 (*) 3.87 - 5.11 MIL/uL   Hemoglobin 10.1 (*) 12.0 - 15.0 g/dL   HCT 31.0 (*) 36.0 - 46.0 %   MCV 82.2  78.0 - 100.0 fL   MCH 26.8  26.0 - 34.0 pg   MCHC 32.6  30.0 - 36.0 g/dL   RDW 14.6  11.5 - 15.5 %   Platelets 183  150 - 400 K/uL  COMPREHENSIVE METABOLIC PANEL     Status:  Abnormal   Collection Time    09/17/13  1:05 AM      Result Value Ref Range   Sodium 138  137 - 147 mEq/L   Potassium 3.6 (*) 3.7 - 5.3 mEq/L   Chloride 102  96 - 112 mEq/L   CO2 24  19 - 32 mEq/L   Glucose, Bld 84  70 - 99 mg/dL   BUN 9  6 - 23 mg/dL   Creatinine, Ser 0.59  0.50 - 1.10 mg/dL   Calcium 8.7  8.4 - 10.5 mg/dL   Total Protein 6.4  6.0 - 8.3 g/dL   Albumin 3.1 (*) 3.5 - 5.2 g/dL   AST 13  0 - 37 U/L   ALT 10  0 - 35 U/L   Alkaline Phosphatase 47  39 - 117 U/L   Total Bilirubin 0.2 (*) 0.3 - 1.2 mg/dL   GFR calc non Af Amer >90  >90 mL/min   GFR calc Af Amer >90  >90 mL/min    MAU Course  Procedures None  MDM UA today shows dehydration IV NS bolus with 25 mg Benadryl, 10 mg Decadron and 10 mg Reglan given.  20 mg IVPB Pepcid given  CBC and CMP drawn Pelvic exam deferred. Was normal last week. Negative Wet prep and GC/Chlamydia Dr. Olevia Bowens also examined the patient at the request of Dr. Glennon Mac. She agrees with plan of care Patient and husband are working with social services to get set up with [facility] Assessment and Plan  A: Constipation Abdominal pain in pregnancy, antepartum Nausea and vomiting in pregnancy prior to [redacted] weeks gestation  P: Discharge home Rx for Phenergan, Dulcolax suppositories and Miralax given to patient Advised increased PO hydration as tolerated First trimester warning signs reviewed Patient advised to return to MAU if symptoms do not improve with medication regimen. Enema may be required at that time.  Patient may return to MAU as needed otherwise  Farris Has, PA-C  09/17/2013, 3:09 AM

## 2013-09-17 NOTE — Discharge Instructions (Signed)
Hiperemesis gravdica  (Hyperemesis Gravidarum Diet) La hiperemesis gravdica es una forma grave de nuseas matinales. Los sntomas son los vmitos frecuentes e intensos. Ocurre durante el primer trimestre de Media planner. La causa son los cambios hormonales rpidos que se producen durante el embarazo Est asociado al 5% de prdida del peso previo al Media planner. La dieta para la hiperemesis se indica para disminuir los sntomas de nuseas y vmitos.  GUA PARA LA ALIMENTACIN  Realice 5 o 6 comidas pequeas por da en vez de tres grandes.  Evite los alimentos con olores fuertes.  Evite beber 30 minutos antes y despus de las comidas.  Evite las comidas fritas o con gran contenido de grasas como salsas con [place] y crema.  Los alimentos a base de fcula suelen [employer]. Entre ellos se incluyen cereales, tostadas, pan, papas, pasta, arroz y pretzels.  Consuma galletitas saladas antes de levantarse por la maana.  Evite los alimentos muy condimentados.  El jengibre tambin es til para Product/process development scientist las nuseas. Aada  de cucharadita al t caliente o beba t de jengibre.  Contine tomando las vitaminas prenatales tal como se le indic. EJEMPLO DEL PLAN DE ALIMENTACIN Desayuno   taza de avena  1 rebanada tostado  1 cucharadita de margarina  1 cucharadita de jalea  1 huevo revuelto Colacin a media maana  1 taza de Air cabin crew [place]  [place]wich simple de jamn  Palitos de zanahoria o apio  1 manzana pequea  3 crackers de graham Colacin de media tarde  Queso y galletas saladas Cena  100 gr. de lomo de cerdo  1 papa pequea al horno  1 cucharada de margarina   taza de brcoli   taza de uvas Colacin de la noche  1 taza de natillas Document Released: 07/30/2008 Document Revised: 07/06/2011 [facility] Patient Information 2014 [place], [place]. Constipacin (Constipation) Se llama constipacin cuando:   Elimina heces (mueve el intestino) menos de 3  veces por semana.  Tiene dificultad para mover el intestino.  Las heces son secas y duras o son ms grandes que lo normal. CUIDADOS EN EL HOGAR   Consuma ms fibra que se encuentra en frutas, verduras y granos enteros como arroz integral y frijoles.  Consuma menos alimentos ricos en grasas y azcar. Estos incluyen patatas fritas, hamburguesas, galletas, dulces y refrescos.  Si no consume suficientes alimentos ricos en fibras, tome productos que tengan agregado de fibra (suplementos).  Beba gran cantidad de lquido para mantener el pis (orina) de tono claro o amarillo plido.  Vaya al bao cuando sienta la necesidad de ir. No espere.  Slo debe tomar los [employer] se los han recetado. No tome medicamentos que le ayuden a Film/video editor intestino (laxantes) sin antes consultarlo con su mdico.  Haga ejercicio en forma regular, o como lo indique su mdico. SOLICITE AYUDA DE INMEDIATO SI:   Observa sangre brillante en las heces (materia fecal).  El estreimiento dura ms de 4 das o [place].  Siente dolor en el vientre (abdomen) o en el ano (recto).  Las heces son delgadas (como un lpiz).  Pierde peso de [place] inexplicable. ASEGRESE DE QUE:   Comprende estas instrucciones.  Controlar su enfermedad.  Solicitar ayuda de inmediato si no mejora o si empeora. Document Released: 05/16/2010 Document Revised: 07/06/2011 [facility] Patient Information 2014 [place], [place].

## 2013-09-17 NOTE — MAU Provider Note (Signed)
Attestation of Attending Supervision of Advanced Practitioner (PA/CNM/NP): Evaluation and management procedures were performed by the Advanced Practitioner under my supervision and collaboration.  I have reviewed the Advanced Practitioner's note and chart, and I agree with the management and plan.  Safiya Girdler, MD, FACOG Attending Obstetrician & Gynecologist Faculty Practice, [facility]  

## 2013-10-10 ENCOUNTER — Encounter: Payer: Self-pay | Admitting: Advanced Practice Midwife

## 2013-10-10 ENCOUNTER — Ambulatory Visit (INDEPENDENT_AMBULATORY_CARE_PROVIDER_SITE_OTHER): Payer: Medicaid Other | Admitting: Advanced Practice Midwife

## 2013-10-10 VITALS — BP 105/64 | HR 78 | Temp 98.8°F | Wt 137.1 lb

## 2013-10-10 DIAGNOSIS — Z3480 Encounter for supervision of other normal pregnancy, unspecified trimester: Secondary | ICD-10-CM

## 2013-10-10 DIAGNOSIS — D259 Leiomyoma of uterus, unspecified: Secondary | ICD-10-CM

## 2013-10-10 DIAGNOSIS — Z3490 Encounter for supervision of normal pregnancy, unspecified, unspecified trimester: Secondary | ICD-10-CM

## 2013-10-10 DIAGNOSIS — N644 Mastodynia: Secondary | ICD-10-CM

## 2013-10-10 DIAGNOSIS — N63 Unspecified lump in unspecified breast: Secondary | ICD-10-CM

## 2013-10-10 DIAGNOSIS — M549 Dorsalgia, unspecified: Secondary | ICD-10-CM

## 2013-10-10 DIAGNOSIS — N6019 Diffuse cystic mastopathy of unspecified breast: Secondary | ICD-10-CM

## 2013-10-10 LAB — POCT URINALYSIS DIP (DEVICE)
BILIRUBIN URINE: NEGATIVE
Glucose, UA: NEGATIVE mg/dL
Hgb urine dipstick: NEGATIVE
Ketones, ur: NEGATIVE mg/dL
LEUKOCYTES UA: NEGATIVE
Nitrite: NEGATIVE
PH: 5.5 (ref 5.0–8.0)
Protein, ur: NEGATIVE mg/dL
Specific Gravity, Urine: >=1.03 (ref 1.005–1.030)
UROBILINOGEN UA: 0.2 mg/dL (ref 0.0–1.0)

## 2013-10-10 LAB — OB RESULTS CONSOLE GBS: STREP GROUP B AG: NEGATIVE

## 2013-10-10 NOTE — Progress Notes (Signed)
New ob packet given Weight gain 25-35lbs Pain- "burns like skin stretch"

## 2013-10-10 NOTE — Progress Notes (Signed)
Subjective:    Shelby Morales is a G2P1001 [redacted]w[redacted]d being seen today for her first obstetrical visit.  Her obstetrical history is significant for NSVD x1. Patient does intend to breast feed. Pregnancy history fully reviewed.  Patient reports pt reports breast tenderness bilaterally and also feels some "knots" on each side. Pt reports tooth pain, and knows her dentist would like to redo a root canal but is unsure of safety in pregnancy.   Filed Vitals:   10/10/13 1552  BP: 105/64  Pulse: 78  Temp: 98.8 F (37.1 C)  Weight: 137 lb 1.6 oz (62.188 kg)    HISTORY: OB History  Gravida Para Term Preterm AB SAB TAB Ectopic Multiple Living  2 1 1       1     # Outcome Date GA Lbr Len/2nd Weight Sex Delivery Anes PTL Lv  2 CUR           1 TRM 12/14/05    F SVD EPI  Y     Past Medical History  Diagnosis Date  . Medical history non-contributory    Past Surgical History  Procedure Laterality Date  . Appendectomy     Family History  Problem Relation Age of Onset  . Hypertension Mother   . Diabetes Maternal Uncle   . Diabetes Paternal Grandfather      Exam    Uterus:     Pelvic Exam:    Perineum: No Hemorrhoids, Normal Perineum   Vulva: normal   Vagina:  normal mucosa, normal discharge   pH:    Cervix: multiparous appearance, no bleeding following Pap, no cervical motion tenderness and no lesions   Adnexa: normal adnexa and no mass, fullness, tenderness   Bony Pelvis: average  System: Breast:  normal appearance, no masses or tenderness, No nipple retraction or dimpling, No nipple discharge or bleeding, No axillary or supraclavicular adenopathy, positive findings: fibrocystic changes and these are bilateral   Skin: normal coloration and turgor, no rashes    Neurologic: oriented, normal, gait normal; reflexes normal and symmetric   Extremities: normal strength, tone, and muscle mass, ROM of all joints is normal   HEENT neck supple with midline trachea and thyroid without  masses   Mouth/Teeth mucous membranes moist, pharynx normal without lesions and dental hygiene good   Neck supple and no masses   Cardiovascular: regular rate and rhythm   Respiratory:  appears well, vitals normal, no respiratory distress, acyanotic, normal RR, ear and throat exam is normal, neck free of mass or lymphadenopathy, chest clear, no wheezing, crepitations, rhonchi, normal symmetric air entry   Abdomen: soft, non-tender; bowel sounds normal; no masses,  no organomegaly   Urinary: urethral meatus normal      Assessment:    Pregnancy: G2P1001 Patient Active Problem List   Diagnosis Date Noted  . BACK PAIN 12/18/2009  . GERD 12/04/2009  . VAGINITIS, CANDIDAL 10/18/2007  . OBSESSIVE-COMPULSIVE DISORDER 09/23/2007  . DEPRESSION 09/23/2007  . SINUSITIS, ACUTE 09/23/2007  . PHARYNGITIS, VIRAL 09/14/2007  . ALLERGIC RHINITIS 08/12/2007  . SHORTNESS OF BREATH 08/12/2007  . CONSTIPATION 07/20/2007  . BACK PAIN, LUMBAR 07/20/2007        Plan:     Initial labs drawn. Discussed dental care in pregnancy.  Letter provided. Breast U/S ordered. Prenatal vitamins. Problem list reviewed and updated. Genetic Screening discussed Quad Screen: declined.  Ultrasound discussed; fetal survey: requested.  Follow up in 4 weeks. 50% of 30 min visit spent on counseling and coordination of care.  LEFTWICH-KIRBY, Jeter Tomey 10/10/2013

## 2013-10-11 ENCOUNTER — Telehealth: Payer: Self-pay

## 2013-10-11 ENCOUNTER — Other Ambulatory Visit: Payer: Self-pay | Admitting: Advanced Practice Midwife

## 2013-10-11 DIAGNOSIS — R9230 Dense breasts, unspecified: Secondary | ICD-10-CM

## 2013-10-11 DIAGNOSIS — N63 Unspecified lump in unspecified breast: Secondary | ICD-10-CM | POA: Insufficient documentation

## 2013-10-11 DIAGNOSIS — R922 Inconclusive mammogram: Secondary | ICD-10-CM

## 2013-10-11 DIAGNOSIS — N644 Mastodynia: Secondary | ICD-10-CM

## 2013-10-11 DIAGNOSIS — N6019 Diffuse cystic mastopathy of unspecified breast: Secondary | ICD-10-CM

## 2013-10-11 DIAGNOSIS — Z3480 Encounter for supervision of other normal pregnancy, unspecified trimester: Secondary | ICD-10-CM | POA: Insufficient documentation

## 2013-10-11 DIAGNOSIS — D259 Leiomyoma of uterus, unspecified: Secondary | ICD-10-CM | POA: Insufficient documentation

## 2013-10-11 LAB — OBSTETRIC PANEL
Antibody Screen: NEGATIVE
Basophils Absolute: 0 10*3/uL (ref 0.0–0.1)
Basophils Relative: 0 % (ref 0–1)
EOS ABS: 0.1 10*3/uL (ref 0.0–0.7)
EOS PCT: 2 % (ref 0–5)
HCT: 31.1 % — ABNORMAL LOW (ref 36.0–46.0)
HEMOGLOBIN: 10.4 g/dL — AB (ref 12.0–15.0)
HEP B S AG: NEGATIVE
LYMPHS ABS: 1.5 10*3/uL (ref 0.7–4.0)
LYMPHS PCT: 30 % (ref 12–46)
MCH: 27 pg (ref 26.0–34.0)
MCHC: 33.4 g/dL (ref 30.0–36.0)
MCV: 80.8 fL (ref 78.0–100.0)
MONOS PCT: 9 % (ref 3–12)
Monocytes Absolute: 0.5 10*3/uL (ref 0.1–1.0)
Neutro Abs: 3 10*3/uL (ref 1.7–7.7)
Neutrophils Relative %: 59 % (ref 43–77)
Platelets: 198 10*3/uL (ref 150–400)
RBC: 3.85 MIL/uL — AB (ref 3.87–5.11)
RDW: 15 % (ref 11.5–15.5)
RH TYPE: POSITIVE
RUBELLA: 1.13 {index} — AB (ref <0.90)
WBC: 5.1 10*3/uL (ref 4.0–10.5)

## 2013-10-11 LAB — HIV ANTIBODY (ROUTINE TESTING W REFLEX): HIV: NONREACTIVE

## 2013-10-11 NOTE — Telephone Encounter (Signed)
Breast ultrasound scheduled for Tuesday June 23 at 0915 at the [facility]. Patient needs to bring insurance card and must not wear powder, lotion, deoderant or perfume.   Called patient on her preferred number with interpreter Raquel. Left message informing patient of appointment date, time and location as well as the above information. Stated patient could call clinic with any questions or concerns.

## 2013-10-12 LAB — HEMOGLOBINOPATHY EVALUATION
HEMOGLOBIN OTHER: 0 %
HGB A: 97.4 % (ref 96.8–97.8)
Hgb A2 Quant: 2.6 % (ref 2.2–3.2)
Hgb F Quant: 0 % (ref 0.0–2.0)
Hgb S Quant: 0 %

## 2013-10-12 LAB — PRESCRIPTION MONITORING PROFILE (19 PANEL)
AMPHETAMINE/METH: NEGATIVE ng/mL
BUPRENORPHINE, URINE: NEGATIVE ng/mL
Barbiturate Screen, Urine: NEGATIVE ng/mL
Benzodiazepine Screen, Urine: NEGATIVE ng/mL
Cannabinoid Scrn, Ur: NEGATIVE ng/mL
Carisoprodol, Urine: NEGATIVE ng/mL
Cocaine Metabolites: NEGATIVE ng/mL
Creatinine, Urine: 183.27 mg/dL (ref >20.0)
Fentanyl, Ur: NEGATIVE ng/mL
MDMA URINE: NEGATIVE ng/mL
MEPERIDINE UR: NEGATIVE ng/mL
Methadone Screen, Urine: NEGATIVE ng/mL
Methaqualone: NEGATIVE ng/mL
NITRITES URINE, INITIAL: NEGATIVE ug/mL
OXYCODONE SCRN UR: NEGATIVE ng/mL
Opiate Screen, Urine: NEGATIVE ng/mL
PROPOXYPHENE: NEGATIVE ng/mL
Phencyclidine, Ur: NEGATIVE ng/mL
TAPENTADOLUR: NEGATIVE ng/mL
Tramadol Scrn, Ur: NEGATIVE ng/mL
Zolpidem, Urine: NEGATIVE ng/mL
pH, Initial: 5.9 pH (ref 4.5–8.9)

## 2013-10-12 LAB — CULTURE, OB URINE

## 2013-10-12 LAB — CYTOLOGY - PAP

## 2013-10-17 ENCOUNTER — Other Ambulatory Visit: Payer: Self-pay

## 2013-10-23 ENCOUNTER — Other Ambulatory Visit: Payer: Self-pay

## 2013-11-22 ENCOUNTER — Ambulatory Visit (INDEPENDENT_AMBULATORY_CARE_PROVIDER_SITE_OTHER): Payer: Self-pay | Admitting: Advanced Practice Midwife

## 2013-11-22 VITALS — BP 95/54 | HR 73 | Temp 98.1°F | Wt 142.6 lb

## 2013-11-22 DIAGNOSIS — N6019 Diffuse cystic mastopathy of unspecified breast: Secondary | ICD-10-CM

## 2013-11-22 DIAGNOSIS — Z3480 Encounter for supervision of other normal pregnancy, unspecified trimester: Secondary | ICD-10-CM

## 2013-11-22 DIAGNOSIS — Z3482 Encounter for supervision of other normal pregnancy, second trimester: Secondary | ICD-10-CM

## 2013-11-22 LAB — POCT URINALYSIS DIP (DEVICE)
BILIRUBIN URINE: NEGATIVE
Glucose, UA: NEGATIVE mg/dL
HGB URINE DIPSTICK: NEGATIVE
Ketones, ur: NEGATIVE mg/dL
NITRITE: NEGATIVE
PROTEIN: NEGATIVE mg/dL
Specific Gravity, Urine: 1.01 (ref 1.005–1.030)
Urobilinogen, UA: 0.2 mg/dL (ref 0.0–1.0)
pH: 5.5 (ref 5.0–8.0)

## 2013-11-22 MED ORDER — PRENATAL MULTIVITAMIN CH: 1.0000 | ORAL_TABLET | Freq: Every day | ORAL | Status: AC

## 2013-11-22 NOTE — Patient Instructions (Addendum)
    Dental Assistance:  If unable to pay or uninsured, contact: [facility]. to become qualified for the adult dental clinic. Patient must be enrolled in [facility] (uninsured, 0-200% FPL, qualifying info).  Enroll in [facility] first, then see Primary Care Physician assigned to you, the PCP makes a dental referral. [facility] will receive referral and contacts patient for appointment.  Patients with Medicaid           [address] [place] (Children up to 110 + Pregnant Women) - [phone]  [place] - Suite [phone]  If unable to pay, or uninsured: contact [place] [phone] in [place] - ([place] only + Pregnant Women), [phone] in [place] only) to become qualified for the adult dental clinic  Must see if eligible to enroll in [place] before enrolling into the [facility] (exemption required) [phone] for an appointment)  [email or web address];   [phone].  If not eligible for ACA, then go by Department of Health and Human Services to see if eligible for "orange card."  [address] [place].  Once you get an orange card, you will have a Primary Care home who will then refer you to dental if needed.        Other Scientist, forensic:   [facility] [phone] (ext [phone])   [address]  Dr. Donn Pierini - [phone]   [facility] [place]   [address] [facility]           [place], [place], [place], [address]           [phone], Ext. [phone]           2nd and 4th Thursday of the month at 6:30am (Simple extractions only - no wisdom teeth or surgery) First come/First serve -First 10 clients served           [facility] [place], [place] and [place] residents only)          [address] [place]elaine Bhat [place], [place], [address]           [phone]                    [place] [facility]           [phone]          [place]          [facility]          [phone]

## 2013-11-22 NOTE — Progress Notes (Signed)
Doing well.  Good fetal movement, denies vaginal bleeding, LOF, regular contractions.  Daily constant back pain and occasional abdominal cramping.  Recommend pregnancy support belt, rest/ice/heat/Tylenol.  Pt reported breast pain at last visit, concern about lumps in breasts.  Breast U/S ordered.  Pt reports today she is unable to pay for breast ultrasound and denies pain in breasts for several weeks.  Discussed exam findings at last visit consistent with fibrocystic breasts and tenderness normal in early pregnancy.  Ultrasound discontinued, but pt to notify provider if future breast concerns. Recommend annual breast exam outside of pregnancy.

## 2013-11-22 NOTE — Progress Notes (Signed)
Pt states she is having low back pain and occasional abdominal cramping.  Pt states she has not had breast pain x2 weeks. She missed her appts for breast US because she was moving and also does not have insurance.

## 2013-11-30 ENCOUNTER — Other Ambulatory Visit: Payer: Self-pay | Admitting: Advanced Practice Midwife

## 2013-11-30 ENCOUNTER — Ambulatory Visit (HOSPITAL_COMMUNITY)
Admission: RE | Admit: 2013-11-30 | Discharge: 2013-11-30 | Disposition: A | Discharge status: Home / Self Care | Payer: Self-pay | Source: Ambulatory Visit | Attending: Advanced Practice Midwife | Admitting: Advanced Practice Midwife

## 2013-11-30 DIAGNOSIS — Z3482 Encounter for supervision of other normal pregnancy, second trimester: Secondary | ICD-10-CM

## 2013-11-30 DIAGNOSIS — Z3689 Encounter for other specified antenatal screening: Secondary | ICD-10-CM | POA: Insufficient documentation

## 2013-12-21 ENCOUNTER — Ambulatory Visit (INDEPENDENT_AMBULATORY_CARE_PROVIDER_SITE_OTHER): Payer: Self-pay | Admitting: Advanced Practice Midwife

## 2013-12-21 VITALS — BP 109/54 | HR 72 | Temp 98.3°F | Wt 145.9 lb

## 2013-12-21 DIAGNOSIS — O283 Abnormal ultrasonic finding on antenatal screening of mother: Secondary | ICD-10-CM

## 2013-12-21 DIAGNOSIS — Z3482 Encounter for supervision of other normal pregnancy, second trimester: Secondary | ICD-10-CM

## 2013-12-21 DIAGNOSIS — Z3480 Encounter for supervision of other normal pregnancy, unspecified trimester: Secondary | ICD-10-CM

## 2013-12-21 DIAGNOSIS — O289 Unspecified abnormal findings on antenatal screening of mother: Secondary | ICD-10-CM

## 2013-12-21 LAB — POCT URINALYSIS DIP (DEVICE)
Bilirubin Urine: NEGATIVE
Glucose, UA: NEGATIVE mg/dL
Hgb urine dipstick: NEGATIVE
KETONES UR: NEGATIVE mg/dL
Nitrite: NEGATIVE
PH: 7 (ref 5.0–8.0)
Protein, ur: NEGATIVE mg/dL
Specific Gravity, Urine: 1.015 (ref 1.005–1.030)
UROBILINOGEN UA: 0.2 mg/dL (ref 0.0–1.0)

## 2013-12-21 NOTE — Patient Instructions (Addendum)
Maternity Support Belt Babies R [place] [facility]  Second Trimester of Pregnancy The second trimester is from week 13 through week 28, months 4 through 6. The second trimester is often a time when you feel your best. Your body has also adjusted to being pregnant, and you begin to feel better physically. Usually, morning sickness has lessened or quit completely, you may have more energy, and you may have an increase in appetite. The second trimester is also a time when the fetus is growing rapidly. At the end of the sixth month, the fetus is about 9 inches long and weighs about 1 pounds. You will likely begin to feel the baby move (quickening) between 18 and 20 weeks of the pregnancy. BODY CHANGES Your body goes through many changes during pregnancy. The changes vary from woman to woman.   Your weight will continue to increase. You will notice your lower abdomen bulging out.  You may begin to get stretch marks on your hips, abdomen, and breasts.  You may develop headaches that can be relieved by medicines approved by your health care provider.  You may urinate more often because the fetus is pressing on your bladder.  You may develop or continue to have heartburn as a result of your pregnancy.  You may develop constipation because certain hormones are causing the muscles that push waste through your intestines to slow down.  You may develop hemorrhoids or swollen, bulging veins (varicose veins).  You may have back pain because of the weight gain and pregnancy hormones relaxing your joints between the bones in your pelvis and as a result of a shift in weight and the muscles that support your balance.  Your breasts will continue to grow and be tender.  Your gums may bleed and may be sensitive to brushing and flossing.  Dark spots or blotches (chloasma, mask of pregnancy) may develop on your face. This will likely fade after the baby is born.  A dark line from your belly button  to the pubic area (linea nigra) may appear. This will likely fade after the baby is born.  You may have changes in your hair. These can include thickening of your hair, rapid growth, and changes in texture. Some women also have hair loss during or after pregnancy, or hair that feels dry or thin. Your hair will most likely return to normal after your baby is born. WHAT TO EXPECT AT YOUR PRENATAL VISITS During a routine prenatal visit:  You will be weighed to make sure you and the fetus are growing normally.  Your blood pressure will be taken.  Your abdomen will be measured to track your baby's growth.  The fetal heartbeat will be listened to.  Any test results from the previous visit will be discussed. Your health care provider may ask you:  How you are feeling.  If you are feeling the baby move.  If you have had any abnormal symptoms, such as leaking fluid, bleeding, severe headaches, or abdominal cramping.  If you have any questions. Other tests that may be performed during your second trimester include:  Blood tests that check for:  Low iron levels (anemia).  Gestational diabetes (between 24 and 28 weeks).  Rh antibodies.  Urine tests to check for infections, diabetes, or protein in the urine.  An ultrasound to confirm the proper growth and development of the baby.  An amniocentesis to check for possible genetic problems.  Fetal screens for spina bifida and Down syndrome. HOME CARE  INSTRUCTIONS   Avoid all smoking, herbs, alcohol, and unprescribed drugs. These chemicals affect the formation and growth of the baby.  Follow your health care provider's instructions regarding medicine use. There are medicines that are either safe or unsafe to take during pregnancy.  Exercise only as directed by your health care provider. Experiencing uterine cramps is a good sign to stop exercising.  Continue to eat regular, healthy meals.  Wear a good support bra for breast  tenderness.  Do not use hot tubs, steam rooms, or saunas.  Wear your seat belt at all times when driving.  Avoid raw meat, uncooked cheese, cat litter boxes, and soil used by cats. These carry germs that can cause birth defects in the baby.  Take your prenatal vitamins.  Try taking a stool softener (if your health care provider approves) if you develop constipation. Eat more high-fiber foods, such as fresh vegetables or fruit and whole grains. Drink plenty of fluids to keep your urine clear or pale yellow.  Take warm sitz baths to soothe any pain or discomfort caused by hemorrhoids. Use hemorrhoid cream if your health care provider approves.  If you develop varicose veins, wear support hose. Elevate your feet for 15 minutes, 3-4 times a day. Limit salt in your diet.  Avoid heavy lifting, wear low heel shoes, and practice good posture.  Rest with your legs elevated if you have leg cramps or low back pain.  Visit your dentist if you have not gone yet during your pregnancy. Use a soft toothbrush to brush your teeth and be gentle when you floss.  A sexual relationship may be continued unless your health care provider directs you otherwise.  Continue to go to all your prenatal visits as directed by your health care provider. SEEK MEDICAL CARE IF:   You have dizziness.  You have mild pelvic cramps, pelvic pressure, or nagging pain in the abdominal area.  You have persistent nausea, vomiting, or diarrhea.  You have a bad smelling vaginal discharge.  You have pain with urination. SEEK IMMEDIATE MEDICAL CARE IF:   You have a fever.  You are leaking fluid from your vagina.  You have spotting or bleeding from your vagina.  You have severe abdominal cramping or pain.  You have rapid weight gain or loss.  You have shortness of breath with chest pain.  You notice sudden or extreme swelling of your face, hands, ankles, feet, or legs.  You have not felt your baby move in over an  hour.  You have severe headaches that do not go away with medicine.  You have vision changes. Document Released: 04/07/2001 Document Revised: 04/18/2013 Document Reviewed: 06/14/2012 [facility] Patient Information 2015 [place], [place]. This information is not intended to replace advice given to you by your health care provider. Make sure you discuss any questions you have with your health care provider.  Breastfeeding Deciding to breastfeed is one of the best choices you can make for you and your baby. A change in hormones during pregnancy causes your breast tissue to grow and increases the number and size of your milk ducts. These hormones also allow proteins, sugars, and fats from your blood supply to make breast milk in your milk-producing glands. Hormones prevent breast milk from being released before your baby is born as well as prompt milk flow after birth. Once breastfeeding has begun, thoughts of your baby, as well as his or her sucking or crying, can stimulate the release of milk from your milk-producing glands.  BENEFITS OF BREASTFEEDING For Your Baby  Your first milk (colostrum) helps your baby's digestive system function better.   There are antibodies in your milk that help your baby fight off infections.   Your baby has a lower incidence of asthma, allergies, and sudden infant death syndrome.   The nutrients in breast milk are better for your baby than infant formulas and are designed uniquely for your baby's needs.   Breast milk improves your baby's brain development.   Your baby is less likely to develop other conditions, such as childhood obesity, asthma, or type 2 diabetes mellitus.  For You   Breastfeeding helps to create a very special bond between you and your baby.   Breastfeeding is convenient. Breast milk is always available at the correct temperature and costs nothing.   Breastfeeding helps to burn calories and helps you lose the weight gained during  pregnancy.   Breastfeeding makes your uterus contract to its prepregnancy size faster and slows bleeding (lochia) after you give birth.   Breastfeeding helps to lower your risk of developing type 2 diabetes mellitus, osteoporosis, and breast or ovarian cancer later in life. SIGNS THAT YOUR BABY IS HUNGRY Early Signs of Hunger  Increased alertness or activity.  Stretching.  Movement of the head from side to side.  Movement of the head and opening of the mouth when the corner of the mouth or cheek is stroked (rooting).  Increased sucking sounds, smacking lips, cooing, sighing, or squeaking.  Hand-to-mouth movements.  Increased sucking of fingers or hands. Late Signs of Hunger  Fussing.  Intermittent crying. Extreme Signs of Hunger Signs of extreme hunger will require calming and consoling before your baby will be able to breastfeed successfully. Do not wait for the following signs of extreme hunger to occur before you initiate breastfeeding:   Restlessness.  A loud, strong cry.   Screaming. BREASTFEEDING BASICS Breastfeeding Initiation  Find a comfortable place to sit or lie down, with your neck and back well supported.  Place a pillow or rolled up blanket under your baby to bring him or her to the level of your breast (if you are seated). Nursing pillows are specially designed to help support your arms and your baby while you breastfeed.  Make sure that your baby's abdomen is facing your abdomen.   Gently massage your breast. With your fingertips, massage from your chest wall toward your nipple in a circular motion. This encourages milk flow. You may need to continue this action during the feeding if your milk flows slowly.  Support your breast with 4 fingers underneath and your thumb above your nipple. Make sure your fingers are well away from your nipple and your baby's mouth.   Stroke your baby's lips gently with your finger or nipple.   When your baby's  mouth is open wide enough, quickly bring your baby to your breast, placing your entire nipple and as much of the colored area around your nipple (areola) as possible into your baby's mouth.   More areola should be visible above your baby's upper lip than below the lower lip.   Your baby's tongue should be between his or her lower gum and your breast.   Ensure that your baby's mouth is correctly positioned around your nipple (latched). Your baby's lips should create a seal on your breast and be turned out (everted).  It is common for your baby to suck about 2-3 minutes in order to start the flow of breast milk. [place]  your baby how to latch on to your breast properly is very important. An improper latch can cause nipple pain and decreased milk supply for you and poor weight gain in your baby. Also, if your baby is not latched onto your nipple properly, he or she may swallow some air during feeding. This can make your baby fussy. Burping your baby when you switch breasts during the feeding can help to get rid of the air. However, teaching your baby to latch on properly is still the best way to prevent fussiness from swallowing air while breastfeeding. Signs that your baby has successfully latched on to your nipple:    Silent tugging or silent sucking, without causing you pain.   Swallowing heard between every 3-4 sucks.    Muscle movement above and in front of his or her ears while sucking.  Signs that your baby has not successfully latched on to nipple:   Sucking sounds or smacking sounds from your baby while breastfeeding.  Nipple pain. If you think your baby has not latched on correctly, slip your finger into the corner of your baby's mouth to break the suction and place it between your baby's gums. Attempt breastfeeding initiation again. Signs of Successful Breastfeeding Signs from your baby:   A gradual decrease in the number of sucks or complete cessation of sucking.    Falling asleep.   Relaxation of his or her body.   Retention of a small amount of milk in his or her mouth.   Letting go of your breast by himself or herself. Signs from you:  Breasts that have increased in firmness, weight, and size 1-3 hours after feeding.   Breasts that are softer immediately after breastfeeding.  Increased milk volume, as well as a change in milk consistency and color by the fifth day of breastfeeding.   Nipples that are not sore, cracked, or bleeding. Signs That Your [employer] is Getting Enough Milk  Wetting at least 3 diapers in a 24-hour period. The urine should be clear and pale yellow by age 27 days.  At least 3 stools in a 24-hour period by age 27 days. The stool should be soft and yellow.  At least 3 stools in a 24-hour period by age 888 days. The stool should be seedy and yellow.  No loss of weight greater than 10% of birth weight during the first 15 days of age.  Average weight gain of 4-7 ounces (113-198 g) per week after age 88 days.  Consistent daily weight gain by age 82 days, without weight loss after the age of 2 weeks. After a feeding, your baby may spit up a small amount. This is common. BREASTFEEDING FREQUENCY AND DURATION Frequent feeding will help you make more milk and can prevent sore nipples and breast engorgement. Breastfeed when you feel the need to reduce the fullness of your breasts or when your baby shows signs of hunger. This is called "breastfeeding on demand." Avoid introducing a pacifier to your baby while you are working to establish breastfeeding (the first 4-6 weeks after your baby is born). After this time you may choose to use a pacifier. Research has shown that pacifier use during the first year of a baby's life decreases the risk of sudden infant death syndrome (SIDS). Allow your baby to feed on each breast as long as he or she wants. Breastfeed until your baby is finished feeding. When your baby unlatches or falls asleep while  feeding from the first breast, offer the  second breast. Because newborns are often sleepy in the first few weeks of life, you may need to awaken your baby to get him or her to feed. Breastfeeding times will vary from baby to baby. However, the following rules can serve as a guide to help you ensure that your baby is properly fed:  Newborns (babies 45 weeks of age or younger) may breastfeed every 1-3 hours.  Newborns should not go longer than 3 hours during the day or 5 hours during the night without breastfeeding.  You should breastfeed your baby a minimum of 8 times in a 24-hour period until you begin to introduce solid foods to your baby at around 84 months of age. BREAST MILK PUMPING Pumping and storing breast milk allows you to ensure that your baby is exclusively fed your breast milk, even at times when you are unable to breastfeed. This is especially important if you are going back to work while you are still breastfeeding or when you are not able to be present during feedings. Your lactation consultant can give you guidelines on how long it is safe to store breast milk.  A breast pump is a machine that allows you to pump milk from your breast into a sterile bottle. The pumped breast milk can then be stored in a refrigerator or freezer. Some breast pumps are operated by hand, while others use electricity. Ask your lactation consultant which type will work best for you. Breast pumps can be purchased, but some hospitals and breastfeeding support groups lease breast pumps on a monthly basis. A lactation consultant can teach you how to hand express breast milk, if you prefer not to use a pump.  CARING FOR YOUR BREASTS WHILE YOU BREASTFEED Nipples can become dry, cracked, and sore while breastfeeding. The following recommendations can help keep your breasts moisturized and healthy:  Avoid using soap on your nipples.   Wear a supportive bra. Although not required, special nursing bras and tank tops  are designed to allow access to your breasts for breastfeeding without taking off your entire bra or top. Avoid wearing underwire-style bras or extremely tight bras.  Air dry your nipples for 3-66minutes after each feeding.   Use only cotton bra pads to absorb leaked breast milk. Leaking of breast milk between feedings is normal.   Use lanolin on your nipples after breastfeeding. Lanolin helps to maintain your skin's normal moisture barrier. If you use pure lanolin, you do not need to wash it off before feeding your baby again. Pure lanolin is not toxic to your baby. You may also hand express a few drops of breast milk and gently massage that milk into your nipples and allow the milk to air dry. In the first few weeks after giving birth, some women experience extremely full breasts (engorgement). Engorgement can make your breasts feel heavy, warm, and tender to the touch. Engorgement peaks within 3-5 days after you give birth. The following recommendations can help ease engorgement:  Completely empty your breasts while breastfeeding or pumping. You may want to start by applying warm, moist heat (in the shower or with warm water-soaked hand towels) just before feeding or pumping. This increases circulation and helps the milk flow. If your baby does not completely empty your breasts while breastfeeding, pump any extra milk after he or she is finished.  Wear a snug bra (nursing or regular) or tank top for 1-2 days to signal your body to slightly decrease milk production.  Apply ice packs to your  breasts, unless this is too uncomfortable for you.  Make sure that your baby is latched on and positioned properly while breastfeeding. If engorgement persists after 48 hours of following these recommendations, contact your health care provider or a Science writer. OVERALL HEALTH CARE RECOMMENDATIONS WHILE BREASTFEEDING  Eat healthy foods. Alternate between meals and snacks, eating 3 of each per day.  Because what you eat affects your breast milk, some of the foods may make your baby more irritable than usual. Avoid eating these foods if you are sure that they are negatively affecting your baby.  Drink milk, fruit juice, and water to satisfy your thirst (about 10 glasses a day).   Rest often, relax, and continue to take your prenatal vitamins to prevent fatigue, stress, and anemia.  Continue breast self-awareness checks.  Avoid chewing and smoking tobacco.  Avoid alcohol and drug use. Some medicines that may be harmful to your baby can pass through breast milk. It is important to ask your health care provider before taking any medicine, including all over-the-counter and prescription medicine as well as vitamin and herbal supplements. It is possible to become pregnant while breastfeeding. If birth control is desired, ask your health care provider about options that will be safe for your baby. SEEK MEDICAL CARE IF:   You feel like you want to stop breastfeeding or have become frustrated with breastfeeding.  You have painful breasts or nipples.  Your nipples are cracked or bleeding.  Your breasts are red, tender, or warm.  You have a swollen area on either breast.  You have a fever or chills.  You have nausea or vomiting.  You have drainage other than breast milk from your nipples.  Your breasts do not become full before feedings by the fifth day after you give birth.  You feel sad and depressed.  Your baby is too sleepy to eat well.  Your baby is having trouble sleeping.   Your baby is wetting less than 3 diapers in a 24-hour period.  Your baby has less than 3 stools in a 24-hour period.  Your baby's skin or the white part of his or her eyes becomes yellow.   Your baby is not gaining weight by 3 days of age. SEEK IMMEDIATE MEDICAL CARE IF:   Your baby is overly tired (lethargic) and does not want to wake up and feed.  Your baby develops an unexplained  fever. Document Released: 04/13/2005 Document Revised: 04/18/2013 Document Reviewed: 10/05/2012 [facility] Patient Information 2015 [place], [place]. This information is not intended to replace advice given to you by your health care provider. Make sure you discuss any questions you have with your health care provider.

## 2013-12-21 NOTE — Progress Notes (Signed)
Interpreter Verdis Frederickson used for check in

## 2013-12-21 NOTE — Progress Notes (Signed)
C/O intermittent sharp low abd pain ~2x/day C/W RLP. Discussed maternity support belt, Discussed LVEIF. Lengthy conversation w/ Pt RE: significance, other testing available. Declines.

## 2014-01-04 ENCOUNTER — Encounter (HOSPITAL_COMMUNITY): Payer: Self-pay | Admitting: *Deleted

## 2014-01-04 ENCOUNTER — Inpatient Hospital Stay (HOSPITAL_COMMUNITY)
Admission: AD | Admit: 2014-01-04 | Discharge: 2014-01-04 | Disposition: A | Discharge status: Home / Self Care | Payer: Self-pay | Source: Ambulatory Visit | Attending: Obstetrics & Gynecology | Admitting: Obstetrics & Gynecology

## 2014-01-04 DIAGNOSIS — B3731 Acute candidiasis of vulva and vagina: Secondary | ICD-10-CM

## 2014-01-04 DIAGNOSIS — O9989 Other specified diseases and conditions complicating pregnancy, childbirth and the puerperium: Principal | ICD-10-CM

## 2014-01-04 DIAGNOSIS — R109 Unspecified abdominal pain: Secondary | ICD-10-CM | POA: Insufficient documentation

## 2014-01-04 DIAGNOSIS — B373 Candidiasis of vulva and vagina: Secondary | ICD-10-CM

## 2014-01-04 DIAGNOSIS — O26892 Other specified pregnancy related conditions, second trimester: Secondary | ICD-10-CM

## 2014-01-04 DIAGNOSIS — O99891 Other specified diseases and conditions complicating pregnancy: Secondary | ICD-10-CM | POA: Insufficient documentation

## 2014-01-04 DIAGNOSIS — N898 Other specified noninflammatory disorders of vagina: Secondary | ICD-10-CM | POA: Insufficient documentation

## 2014-01-04 LAB — WET PREP, GENITAL
CLUE CELLS WET PREP: NONE SEEN
Trich, Wet Prep: NONE SEEN

## 2014-01-04 LAB — URINALYSIS, ROUTINE W REFLEX MICROSCOPIC
BILIRUBIN URINE: NEGATIVE
GLUCOSE, UA: NEGATIVE mg/dL
Hgb urine dipstick: NEGATIVE
KETONES UR: NEGATIVE mg/dL
Nitrite: NEGATIVE
PH: 7.5 (ref 5.0–8.0)
PROTEIN: NEGATIVE mg/dL
Specific Gravity, Urine: 1.015 (ref 1.005–1.030)
Urobilinogen, UA: 0.2 mg/dL (ref 0.0–1.0)

## 2014-01-04 LAB — POCT FERN TEST: POCT FERN TEST: NEGATIVE

## 2014-01-04 LAB — URINE MICROSCOPIC-ADD ON

## 2014-01-04 LAB — FETAL FIBRONECTIN: Fetal Fibronectin: NEGATIVE

## 2014-01-04 MED ORDER — TERCONAZOLE 0.4 % VA CREA: 1.0000 | TOPICAL_CREAM | Freq: Every day | VAGINAL | Status: DC

## 2014-01-04 MED ORDER — GI COCKTAIL ~~LOC~~
30.0000 mL | Freq: Once | ORAL | Status: AC
  Administered 2014-01-04: 30 mL via ORAL
  Filled 2014-01-04: qty 30

## 2014-01-04 MED ORDER — FLUCONAZOLE 150 MG PO TABS
150.0000 mg | ORAL_TABLET | Freq: Every day | ORAL | Status: DC
  Administered 2014-01-04: 150 mg via ORAL
  Filled 2014-01-04: qty 1

## 2014-01-04 MED ORDER — TRIAMCINOLONE ACETONIDE 0.1 % EX CREA
TOPICAL_CREAM | Freq: Three times a day (TID) | CUTANEOUS | Status: DC
  Administered 2014-01-04: 11:00:00 via TOPICAL
  Filled 2014-01-04: qty 15

## 2014-01-04 NOTE — Discharge Instructions (Signed)
Infeccin por cndida (Candida Infection) Una infeccin por Cndida (tambin llamado infeccin por hongos levaduriformes, o infeccin por Monilia) es un crecimiento excesivo de hongos que puede ocurrir en cualquier parte del cuerpo. Una infeccin por cndida comnmente ocurre en las zonas ms calientes y hmedas del cuerpo. Usualmente, la infeccin permanece localizada pero puede diseminarse y convertirse en una infeccin sistmica. Una infeccin por cndida puede ser signo de un trastorno ms grave como diabetes, leucemia, o SIDA. Una infeccin por cndida puede ocurrir tanto en hombres como en mujeres. En mujeres, la vaginitis Cndida es una infeccin vaginal. Se trata de una de las causas ms frecuentes de la vaginitis. Los hombres no suelen tener sntomas [employer] se le aparecen otros problemas. Pueden descubrir que tienen una infeccin por hongos porque su compaera sexual los tiene. Es ms probable que los hombres no circuncisos adquieran una infeccin por hongo que aquellos que estn circuncindados. Esto se debe a que el glande no circuncidado no est expuesto al aire y no se mantiene tan seco como un glande circuncidado. [employer] pueden desarrollar infecciones por cndida en la zona de la dentadura. CAUSAS Mujeres  Antibiticos.  Medicamento con esteroides Research scientist (medical).  Tener sobrepeso (obesidad).  Diabetes.  Sistema inmune deficiente.  Ciertas enfermedades.  Medicamentos inmunosupresores para pacientes con trasplante de rganos.  Quimioterapia.  El [place].  Menstruacin.  Estrs o fatiga.  Consumo de drogas de forma intravenosa.  Anticonceptivos orales.  Utilizar ropa ajustada en la zona de la entrepierna.  Contagio a partir de un compaero sexual que ya tiene la infeccin.  Los espermicidas.  Catteres intravenosos, urinarios, u de otro tipo. Hombres  Contagio a partir de una compaera sexual que ya tiene la infeccin.  Tener sexo oral o  anal con una persona que tiene la infeccin.  Los espermicidas.  Diabetes.  Antibiticos.  Sistema inmune deficiente.  Medicamentos que suprimen el sistema inmune.  El uso de drogas por va intravenosa.  Intravenosa, urinarias o catteres otros. SNTOMAS Mujeres  Flujo vaginal espeso y blanco.  Picazn vaginal.  Enrojecimiento e hinchazn en la zona de la vagina.  Irritacin de los labios de la vagina y perineo.  lceras en labios vaginales y perineo.  Relaciones sexuales dolorosas.  Bajo nivel de Dispensing optician (hipoglucemia).  Dolor al [place].  Infecciones en la vejiga.  Problemas intestinales como constipacin, indigestin, mal aliento, hinchazn, gases, diarrea o heces blandas. Hombres  Primero los hombres desarrollan problemas intestinales como constipacin, indigestin, mal aliento, hinchazn, gases, diarrea o heces blandas.  Piel del pene seca y [place] con picazn o molestias.  Prurito de Advertising account executive.  Piel seca y escamosa.  Pie de atleta.  Hipoglucemia. DIAGNSTICO Mujeres  Se revisa el historial y se realiza un anlisis.  La supuracin se observa con un microscopio.  Deber tomarse un cultivo de Designer, multimedia. Hombres  Se revisa el historial y se realiza un anlisis.  Se observar cualquier secrecin proveniente del pene y la piel quebrada en el microscopio y se Optometrist un cultivo.  Podrn realizarle cultivos de heces. TRATAMIENTO Mujeres  Supositorios y cremas antifngicos vaginales.  Cremas medicadas para disminuir la picazn e irritacin de la zona externa de la vagina.  Aplique una bolsa caliente en la zona del perineo para disminuir molestias o inflamacin.  Medicamentos antifngicos orales.  Supositorios vaginales o cremas medicinales, para infecciones repetidas o recurrentes.  Lave y seque la zona por completo antes de Film/video editor.  La ingesta de yogur  con lactobacillus le ayudar con la prevencin y [place].  Si las cremas y supositorios no funcionan, la aplicacin de solucin violeta de genciana puede ayudar. Hombres  Cremas anti hongos y medicamentos orales anti hongos.  A veces el tratamiento deber continuar por 30 das despus de que los sntomas desaparecen para prevenir la recurrencia. INSTRUCCIONES PARA EL CUIDADO DOMICILIARIO Mujeres  Utilice ropa interior de algodn y evite las ropas ajustadas.  Evite el papel higinico de color o perfumado y los tampones o toallitas con desodorante.  No utilice duchas vaginales.  Mantenga la diabetes bajo control.  Tome todos los medicamentos tal como se le indic.  Mantenga su piel limpia y [place].  Consuma leche o yogur con lactobacillus de [place] regular. Si contrae infecciones por levaduras frecuentes y cree saber cul es la infeccin, existen medicamentos de [place]. Si la infeccin no parece curarse en 3 das, hable con el mdico.  Comunique a su compaero sexual que padece una infeccin por hongos. El tambin Warden/ranger, en especial si la infeccin no desaparece o es recurrente. Hombres  Mantenga su piel limpia y [place].  Mantenga la diabetes bajo control.  Tome todos los medicamentos tal como se le indic.  Comunique a su compaera sexual que sufre una infeccin por hongos. SOLICITE ANTENCIN MDICA SI:  Los sntomas no desaparecen o empeoran luego de 1 semana de tratamiento.  Usted tiene una temperatura oral de ms de 38,9 C (102 F).  Presenta dificultades para tragar o para comer durante un tiempo prolongado.  Aparecen ampollas en los genitales.  Si aparece una hemorragia vaginal y no es el momento del perodo.  Siente dolor abdominal.  Presentara problemas intestinales como los ya descritos.  Si se siente dbil o desfalleciente.  Siente dolor al Garment/textile technologist u observa una mayor cantidad [place].  Tiene dolor durante las [employer]. ASEGRESE DE QUE:  Comprende esas  instrucciones para el alta mdica.  Controlar su enfermedad.  Pedir ayuda de inmediato si no mejora o empeora. Document Released: 04/13/2005 Document Revised: 08/28/2013 [facility] Patient Information 2015 [place], [place]. This information is not intended to replace advice given to you by your health care provider. Make sure you discuss any questions you have with your health care provider.

## 2014-01-04 NOTE — MAU Provider Note (Signed)
First Provider Initiated Contact with Patient 01/04/14 [phone]      Chief Complaint:  Vaginal Discharge and Abdominal Pain   Shelby Morales is  28 y.o. G2P1001 at [redacted]w[redacted]d presents complaining of Vaginal Discharge and Abdominal Pain 1. The vaginal discharge started approximately 2 weeks ago. It is copious yellow thick discharge that is malodorous.  She noted itching and pain 3 days ago. +Burnning with urination as the urine "toches" her. No fever/chills.    2. She also reports leaking 2-3x per day that gets her underwear wet that started approximately 1 week ago. Denies any vaginal bleeding or large gush of fluid.  3. She notes LLQ pain started 7 days ago on initial presentation, however tells this provider it is more suprapubic now. Last BM was yesterday. No constipation, diarrhea, N/V.   4. Left sided chest/breast pain since this AM. She states it is burnning in nature and does not radiate anywhere. She feels like the pain is in a straight vertical line. Denies any foul taste, N/V. Denies any SOB, diaphoresis, dizziness, or discomfort in her arm/shoulders.  When asked whether it is worse with positioning, she states she's only had it since this AM so she isn't sure.     Obstetrical/Gynecological History: OB History   Grav Para Term Preterm Abortions TAB SAB Ect Mult Living   2 1 1       1      Past Medical History: Past Medical History  Diagnosis Date  . Medical history non-contributory     Past Surgical History: Past Surgical History  Procedure Laterality Date  . Appendectomy      Family History: Family History  Problem Relation Age of Onset  . Hypertension Mother   . Diabetes Maternal Uncle   . Diabetes Paternal Grandfather     Social History: History  Substance Use Topics  . Smoking status: Never Smoker   . Smokeless tobacco: Never Used  . Alcohol Use: No    Allergies:  Allergies  Allergen Reactions  . Aspirin Itching  . Morphine And Related     Reaction  Heart  pain  . Tylenol [Acetaminophen] Diarrhea    Abd pain and headache also    Meds:  Prescriptions prior to admission  Medication Sig Dispense Refill  . Prenatal Vit-Fe Fumarate-FA (PRENATAL MULTIVITAMIN) TABS tablet Take 1 tablet by mouth daily at 12 noon.  30 tablet  12    Review of Systems -   Review of Systems  Constitutional: Negative for fever, chills, weight loss, malaise/fatigue and diaphoresis.  HENT: Negative for hearing loss, ear pain, nosebleeds, congestion, sore throat, neck pain, tinnitus and ear discharge.   Eyes: Negative for blurred vision, double vision, photophobia, pain, discharge and redness.  Respiratory: Negative for cough, hemoptysis, sputum production, shortness of breath, wheezing and stridor.   Cardiovascular: Negative for  palpitations, orthopnea,  leg swelling  Gastrointestinal: Negative for  heartburn, nausea, vomiting, diarrhea, constipation, blood in stool Genitourinary: Negative for urgency, frequency, hematuria and flank pain.  Musculoskeletal: Negative for myalgias, back pain, joint pain and falls.  Neurological: Negative for dizziness, tingling, tremors, sensory change, speech change, focal weakness, seizures, loss of consciousness, weakness and headaches.  Endo/Heme/Allergies: Negative for environmental allergies and polydipsia. Does not bruise/bleed easily.  Psychiatric/Behavioral: Negative for depression, suicidal ideas, hallucinations, memory loss and substance abuse. The patient is not nervous/anxious and does not have insomnia.      Physical Exam  Blood pressure 104/62, pulse 82, temperature 98.3 F (36.8 C), temperature source  Oral, resp. rate 18, height 5\' 6"  (1.676 m), last menstrual period 07/14/2013. GENERAL: Well-developed, well-nourished female in no acute distress.  LUNGS: Clear to auscultation bilaterally.  HEART: Regular rate and rhythm. Some tenderness to palpation over the medial aspect of the left breast, without notable  lumps. ABDOMEN: Soft, nondistended, gravid. Patient notes tenderness to palpation in the LLQ and suprapubic area. Note referred pain to the LLQ with palpation in the RLQ. No rebound or guarding.  EXTREMITIES: Nontender, no edema, 2+ distal pulses. DTR's 2+ SPECULUM EXAM: Vulva very erythematous with copious white/yellow discharge. No pooling of fluid in the vaginal vault. No expulsion of fluid from the cervical os with cough. No blood noted. Cervix looked visually closed. CERVICAL EXAM: closed FHT:  Baseline rate 150 bpm   Variability moderate  Accelerations present   Decelerations none Contractions: very infrequent   Labs: Results for orders placed during the hospital encounter of 01/04/14 (from the past 24 hour(s))  URINALYSIS, ROUTINE W REFLEX MICROSCOPIC   Collection Time    01/04/14  8:50 AM      Result Value Ref Range   Color, Urine YELLOW  YELLOW   APPearance CLOUDY (*) CLEAR   Specific Gravity, Urine 1.015  1.005 - 1.030   pH 7.5  5.0 - 8.0   Glucose, UA NEGATIVE  NEGATIVE mg/dL   Hgb urine dipstick NEGATIVE  NEGATIVE   Bilirubin Urine NEGATIVE  NEGATIVE   Ketones, ur NEGATIVE  NEGATIVE mg/dL   Protein, ur NEGATIVE  NEGATIVE mg/dL   Urobilinogen, UA 0.2  0.0 - 1.0 mg/dL   Nitrite NEGATIVE  NEGATIVE   Leukocytes, UA LARGE (*) NEGATIVE  URINE MICROSCOPIC-ADD ON   Collection Time    01/04/14  8:50 AM      Result Value Ref Range   Squamous Epithelial / LPF MANY (*) RARE   WBC, UA 3-6  <3 WBC/hpf   Bacteria, UA FEW (*) RARE  WET PREP, GENITAL   Collection Time    01/04/14 10:25 AM      Result Value Ref Range   Yeast Wet Prep HPF POC MODERATE (*) NONE SEEN   Trich, Wet Prep NONE SEEN  NONE SEEN   Clue Cells Wet Prep HPF POC NONE SEEN  NONE SEEN   WBC, Wet Prep HPF POC MANY (*) NONE SEEN  FETAL FIBRONECTIN   Collection Time    01/04/14 10:25 AM      Result Value Ref Range   Fetal Fibronectin NEGATIVE  NEGATIVE  POCT FERN TEST   Collection Time    01/04/14 10:35 AM       Result Value Ref Range   POCT Fern Test Negative = intact amniotic membranes     Imaging Studies:  No results found.  Assessment: Cyera Macario Morales is  28 y.o. G2P1001 at [redacted]w[redacted]d presents with multiple complaints.  1. Yellow vaginal discharge x 2 weeks, pruritus, and burning with urination. Symptoms, physical exam, and wet prep consistent with vaginal yeast infection. This could also the the source of the patients suprapubic pain.   2. Leaking 2-3x daily: Does not sound convincing for membrane rupture. No fluid pooling on exam. FFN and fern negative. Most likely liquidized mucous vs urinary incontinence.  3. Patient also endorsed localized pain over the medial aspect of her left breast/chest pain, which she felt was secondary to the her vaginal discharge. No SOB. Given patient's age and risk factors, this is not likely cardiac in nature. GI cocktail was given in the MAU which  resolved her symptoms completely. This makes me feel the patient's symptoms are more GI related in nature.  Plan: Membranes intact, not in labor: discussed labor precautions and kick counts  Vaginal yeast infection:  Triamcinolone cream and Diflucan 150mg  PO given in the MAU.  Prescription provided for  Terazol 7 If Terazol too expensive, discussed using Monistat 7 OTC Return to clinic precautions given U/A showed large leukocytes- this was most likely due to a poor specimen, however an OB culture was obtained and pending  Chest pain:  Discussed that this is most likely acid reflux May use Tums or Zantac if symptoms return however did not provide a Rx as this was an isolated incident  Return precautions provided: worsening chest pain, pain not relieved by Tums/Zantac, chest pain associated with diaphoresis/lighhtheadeness/dizzness, SOB.  Archie Patten 9/10/201511:57 AM  I was present for the exam and agree with above.  [place], [place] 01/04/2014 5:19 PM

## 2014-01-04 NOTE — MAU Note (Addendum)
Pt reports she has been having a yellow vaginal discharge with an odor for the past 2 weeks. Itching and pian started 3 days ago Also reports "leaking 2-3 times a day that her underwear gets wet. Also c/o LLQ pain that started 3 days ago. Feels like the baby is "stuck in that  Position." Pt also reports some pain in her left breast.

## 2014-01-05 LAB — GC/CHLAMYDIA PROBE AMP
CT PROBE, AMP APTIMA: NEGATIVE
GC Probe RNA: NEGATIVE

## 2014-01-05 LAB — OB RESULTS CONSOLE GC/CHLAMYDIA
CHLAMYDIA, DNA PROBE: NEGATIVE
Gonorrhea: NEGATIVE

## 2014-01-06 LAB — CULTURE, OB URINE

## 2014-01-18 ENCOUNTER — Encounter: Payer: Self-pay | Admitting: Family Medicine

## 2014-02-01 ENCOUNTER — Encounter: Payer: Self-pay | Admitting: Obstetrics & Gynecology

## 2014-02-01 ENCOUNTER — Other Ambulatory Visit: Payer: Self-pay

## 2014-02-01 DIAGNOSIS — Z3482 Encounter for supervision of other normal pregnancy, second trimester: Secondary | ICD-10-CM

## 2014-02-01 LAB — CBC
HCT: 32.1 % — ABNORMAL LOW (ref 36.0–46.0)
HEMOGLOBIN: 10.3 g/dL — AB (ref 12.0–15.0)
MCH: 25.1 pg — ABNORMAL LOW (ref 26.0–34.0)
MCHC: 32.1 g/dL (ref 30.0–36.0)
MCV: 78.3 fL (ref 78.0–100.0)
Platelets: 204 10*3/uL (ref 150–400)
RBC: 4.1 MIL/uL (ref 3.87–5.11)
RDW: 15.4 % (ref 11.5–15.5)
WBC: 5.7 10*3/uL (ref 4.0–10.5)

## 2014-02-01 LAB — RPR

## 2014-02-02 LAB — GLUCOSE TOLERANCE, 1 HOUR (50G) W/O FASTING: Glucose, 1 Hour GTT: 61 mg/dL — ABNORMAL LOW (ref 70–140)

## 2014-02-02 LAB — HIV ANTIBODY (ROUTINE TESTING W REFLEX): HIV: NONREACTIVE

## 2014-02-05 ENCOUNTER — Ambulatory Visit (INDEPENDENT_AMBULATORY_CARE_PROVIDER_SITE_OTHER): Payer: Self-pay | Admitting: Obstetrics & Gynecology

## 2014-02-05 ENCOUNTER — Encounter: Payer: Self-pay | Admitting: Obstetrics & Gynecology

## 2014-02-05 VITALS — BP 90/56 | HR 72 | Temp 97.9°F | Wt 148.8 lb

## 2014-02-05 DIAGNOSIS — Z23 Encounter for immunization: Secondary | ICD-10-CM

## 2014-02-05 DIAGNOSIS — Z3483 Encounter for supervision of other normal pregnancy, third trimester: Secondary | ICD-10-CM

## 2014-02-05 NOTE — Progress Notes (Signed)
Patient is Spanish-speaking only, Spanish interpreter present for this encounter. Patient feels pain is secondary to movement; reports increased FM recently Feels she cannot give a urine sample for now, also declines pelvic exam.  Declines flu and Tdap vaccines for now.  No other complaints or concerns.  Labor and fetal movement precautions reviewed.

## 2014-02-05 NOTE — Patient Instructions (Signed)
Regrese a la clinica cuando tenga su cita. Si tiene problemas o preguntas, llama a la clinica o vaya a la sala de [place]. Vacuna contra el ttanos, la difteria y la tos ferina (Tdap): lo que debe saber (Tdap Vaccine (Tetanus, Diphtheria, Pertussis): What You Need to Know) 1. Por qu vacunarse? El ttanos, la difteria y la tos ferina pueden ser enfermedades muy graves, incluso para los adolescentes y los adultos. La vacuna Tdap nos puede proteger de estas enfermedades. El TTANOS (trismo) provoca entumecimiento y Writer dolorosa de los msculos, por lo general, en todo el cuerpo.  Puede causar el endurecimiento de los msculos de la cabeza y el cuello, de modo que impide abrir la boca, tragar y en algunos casos, Ambulance person. El ttanos causa la muerte de 1 de cada 5 personas que se infectan. La DIFTERIA puede hacer que se forme una membrana gruesa en el fondo de la garganta.  Puede causar problemas respiratorios, parlisis, insuficiencia cardaca e incluso la muerte. La TOS FERINA causa episodios de tos graves, que pueden hacer difcil la respiracin y causar vmitos y trastornos del sueo.  Tambin puede ser la causa de prdida de Powers, incontinencia y Metallurgist de Theatre stage manager. Dos de cada 100 adolescentes y 5 de cada 100 adultos que se enferman de tos ferina deben ser hospitalizados o tienen complicaciones, que podran incluir neumona y [place]. Estas enfermedades son provocadas por bacterias. La difteria y el pertusis se contagian de persona a persona a travs de la tos o el estornudo. El ttanos ingresa al organismo a travs de cortes, rasguos o heridas. Antes de las vacunas, en los [place] se vieron ms de 200000 casos al ao de difteria y tos [place] y cientos de casos de ttanos. Desde el inicio de la vacunacin, los casos de ttanos y difteria han disminuido alrededor del 99% y los casos de tos ferina alrededor del 80%. 2. Edward Jolly Tdap La vacuna Tdap  protege a adolescentes y adultos contra el ttanos, la difteria y la tos [place]. Una dosis de Tdap se administra a los 62 o 12 aos. Las [employer] no recibieron la vacuna Tdap a esa edad deben recibirla tan pronto como sea posible. Es muy importante que los mdicos y todos aquellos que tengan contacto cercano con bebs menores de 12 meses reciban la Tdap. Las mujeres deben recibir una dosis de Tdap en cada Media planner, para proteger al recin nacido de la tos [place]. Los nios tienen mayor riesgo de complicaciones graves y potencialmente mortales debido a la tos [place]. Una vacuna similar, llamada Td, protege contra el ttanos y la difteria, pero no contra la tos [place]. Cada 10 aos debe recibirse un refuerzo de Td. La Tdap se puede administrar como uno de estos refuerzos, si todava no ha recibido una dosis. Tambin se puede aplicar despus de un corte o quemadura grave para prevenir la infeccin por ttanos. El mdico le dar ms informacin. La Tdap puede administrarse de manera segura simultneamente con otras vacunas. 3. Algunas personas no deben recibir esta vacuna  Si alguna vez tuvo una reaccin alrgica potencialmente mortal despus de Ardelia Mems dosis de la vacuna contra el ttanos, la diferia o la tos [place], O tuvo una alergia grave a cualquiera de los componentes de esta vacuna, no debe aplicarse la vacuna. Informe a su mdico si usted sufre algn tipo de alergia grave.  Si estuvo en coma o sufri mltiples convulsiones dentro de los 7 das posteriores despus de una dosis de DTP  o DTaP, no debe recibir la Tdap, salvo que se encuentre otra causa. En este caso puede recibir la Td.  Consulte con su mdico si:  tiene epilepsia u otra enfermedad del sistema nervioso,  siente dolor intenso o se hincha despus de recibir cualquier vacuna contra la difteria, el ttanos o la tos [place],  alguna vez ha sufrido el sndrome de Curator,  no se siente Pharmacologist en que se ha programado la  vacuna. 4. Riesgos de [place] reaccin a la vacuna Con cualquier medicamento, incluyendo las vacunas, existe la posibilidad de que aparezcan efectos secundarios. Estos son leves y desaparecen por s solos, pero tambin son posibles las reacciones graves. Despus de [place], es posible tener desmayos breves, que causen lesiones por la cada. Sentarse o recostarse durante 15 minutos puede ayudar a Scientist, clinical (histocompatibility and immunogenetics). Informe al mdico si se siente mareado o aturdido, tiene [employer] visin o zumbidos en los odos. Problemas leves despus de la vacuna Tdap (que no interfirieron en otras actividades)  Dolor en el sitio de la inyeccin (alrededor de 1 de cada 4 adolescentes o 2 de cada 3 adultos).  Enrojecimiento o Estate agent de la inyeccin (alrededor de 1 de cada 5 personas)  Fiebre leve de al menos 100,10F (38C) (hasta alrededor de 1 cada 25 adolescentes o 1 de cada 100 adultos)  Dolor de Pensions consultant (alrededor de 3 o 4 de cada 10 personas )  Cansancio (alrededor de 1 de cada 3 o 4 personas)  Nuseas, vmitos, diarrea, dolor de estmago (hasta 1 de cada 4 adolescentes o 1 de cada 10 adultos)  Escalofros, dolores corporales, dolores articulares, erupciones, inflamacin de las glndulas (poco frecuente). Problemas moderados despus de la vacuna Tdap (que interfirieron en las [place], pero no exigieron atencin mdica)  Management consultant de la inyeccin (alrededor de 1 de cada 5 adolescentes o 1 de cada 100 adultos)  Enrojecimiento o inflamacin (hasta 1 de cada 16 adolescentes y 1 de cada 25 adultos)  Fiebre de ms de 102F (38,8C) (alrededor de 1 de cada 100 adolescentes o 1 de cada 250 adultos)  Dolor de Pensions consultant (alrededor de 3 de cada 20 adolescentes y 1 de cada 10 adultos)  Nuseas, vmitos, diarrea, dolor de Paramedic (hasta 1 o 3 de cada 100 personas)  Hinchazn de todo el brazo en el que se aplic la vacuna (hasta alrededor de 3 de cada 100 personas). Problemas graves  despus de la vacuna Tdap (que impidieron Optometrist las actividades habituales; exigieron atencin mdica)  Inflamacin, dolor intenso, sangrado y enrojecimiento en el brazo, en el sitio de la inyeccin (poco frecuente). Despus de la administracin de cualquier vacuna, puede ocurrir Nurse, mental health grave (se calcula que menos de 1 en un milln de dosis). 5. Qu pasa si hay una reaccin grave? A qu signos debo estar atento?  Observe todo lo que le preocupe, como signos de una reaccin alrgica grave, fiebre muy alta o cambios en el comportamiento. Los signos de Nurse, mental health grave pueden incluir ronchas, hinchazn de la cara y la garganta, dificultad para respirar, latidos cardacos acelerados, mareos y debilidad. Pueden comenzar entre unos pocos minutos y algunas horas despus de la vacunacin. Qu debo hacer?  Si usted piensa que se trata de una reaccin alrgica grave o de otra emergencia que no puede esperar, llame al 911 o lleve a la persona al hospital ms cercano. Sino, llame a su mdico.  Despus, la reaccin debe informarse al [employer] de  Informacin sobre Efectos Adversos de las [place] (Vaccine Adverse Event Reporting FIEPPI,RJJOA). Su mdico puede presentar este informe, o puede hacerlo usted mismo a travs del sitio web de VAERS, en [email or web address], o llamando al [phone]. El VAERS es solo para Electrical engineer. No brindan consejo mdico. 6. Programa Nacional de Compensacin de Daos por [place] de Compensacin de Daos por Clinical biochemist (National Vaccine Injury Compensation Program, VICP) es un programa federal que fue creado para Patent examiner a las personas que puedan haber sufrido daos al recibir ciertas vacunas. Aquellas personas que consideren que han sufrido un dao como consecuencia de una vacuna y quieren saber ms acerca del programa y cmo presentar Raechel Chute, pueden llamar al [phone] o visitar su sitio web en  [email or web address]. 7. Cmo puedo obtener ms informacin?  Consulte a su mdico.  Comunquese con el servicio de salud de su localidad o su estado.  Comunquese con los Centros para Building surveyor y la Prevencin de Probation officer for Disease Control and Prevention , CDC).  Llame al [phone] o visite el sitio web de [employer] en [email or web address]/. Declaracin de informacin sobre la vacuna contra la difteria, el ttanos y la tos ferina (Tdap) de los CDC (09/03/11) Document Released: 03/30/2012 Document Revised: 08/28/2013 ExitCare Patient Information 2015 [place], [place]. This information is not intended to replace advice given to you by your health care provider. Make sure you discuss any questions you have with your health care provider.  El Media planner y la gripe (Pregnancy and Influenza) La gripe es una infeccin del tracto respiratorio. Si est embarazada, tiene ms probabilidades de contagiarse de gripe. Tambin tiene ms probabilidades de que esta enfermedad sea ms grave. Esto se debe a que Horticulturist, commercial capacidad del cuerpo de combatir las infecciones (debilita el sistema inmunitario). Adems, ejerce un esfuerzo adicional en el corazn y los pulmones, lo que aumenta las probabilidades de tener complicaciones. Si tiene un caso grave de gripe, en especial con fiebre alta, puede ser peligroso para el beb en desarrollo. Puede provocar un Mat Carne de parto prematuro. CMO SE CONTAGIAN DE GRIPE LAS PERSONAS? La gripe es causada por el virus de la influenza, que es ms frecuente en otoo e invierno. Se propaga cuando las partculas del virus se transmiten de persona a Nurse, learning disability. Se puede contagiar de gripe si est cerca de una persona enferma que tose o estornuda. Tambin se puede contagiar si toca algo en lo que est el virus y luego se toca la cara. CMO ME PUEDO PROTEGER CONTRA LA GRIPE?  Vacnese contra la gripe. La mejor [employer] Services de prevenir esta enfermedad  es vacunarse antes de que comience la temporada de gripe. Esto no es peligroso para el beb en desarrollo. Incluso puede ayudar a protegerlo de la gripe hasta 6 meses despus del nacimiento. La inyeccin contra la gripe es un tipo de vacuna contra esta enfermedad. Otro tipo es la vacuna en forma de aerosol nasal. No se administre la vacuna en forma de aerosol nasal. No est aprobada para el embarazo.  No entre en contacto directo con personas enfermas.  No comparta alimentos, bebidas ni utensilios con Standard Pacific.  Lvese las manos con frecuencia. Utilice un desinfectante para manos si no dispone de [place] y [place]. QU DEBO HACER SI [place]? Si tiene sntomas de gripe, llame al mdico de inmediato. Los sntomas son:  Cristy Hilts o escalofros.  Dolores musculares.  Dolor de [place].  Dolor de Investment banker, operational.  Congestin nasal.  Tos.  Cansancio.  Prdida del apetito.  Vmitos.  Diarrea. Posiblemente pueda tomar un medicamento antiviral para evitar que la gripe se vuelva grave y para disminuir su duracin. QU DEBO HACER EN EL HOGAR SI ME DIAGNOSTICAN GRIPE?  No tome ningn medicamento, como medicamentos para el resfro o la gripe, a menos que el mdico se lo haya indicado.  Si toma medicamentos antivirales, asegrese de terminarlos, aunque comience a sentirse mejor.  Beba suficiente lquido para Consulting civil engineer orina clara o de color amarillo plido.  Descanse lo suficiente. [place] DE INMEDIATO SI [place]?  Tiene dificultad para respirar.  Siente dolor en el pecho.  Comienza a Facilities manager de parto.  Tiene fiebre alta que no baja despus de tomar medicamentos.  No siente los movimientos del beb.  Tiene diarrea o vmitos que no desaparecen. Document Released: 07/10/2008 Document Revised: 04/18/2013 [facility] Patient Information 2015 [place]. This information is not intended to replace advice given to you by your health  care provider. Make sure you discuss any questions you have with your health care provider. Vacuna antigripal (vacuna antigripal, inactivada o recombinante) 2014-2015: lo que debe saber (Influenza Vaccine (Flu Vaccine, Inactivated or Recombinant) 2014-2015: What You Need to Know) 1. Por qu vacunarse? La influenza ("gripe") es una enfermedad contagiosa que se propaga por los [place] en invierno, por lo general entre octubre y [place]. La gripe es causada por los virus de la influenza y se propaga principalmente por la tos, el estornudo y Network engineer. Cualquier persona puede Emergency planning/management officer gripe, Armed forces training and education officer el riesgo es mayor entre los nios. Los sntomas aparecen rpidamente y [place]. Estos pueden ser:  Cristy Hilts o escalofros.  Dolor de Investment banker, operational.  Dolores musculares.  Fatiga.  Tos.  Dolor de [place].  Secrecin o congestin nasal. La gripe puede hacer que algunas personas se enfermen ms que otras. [employer]erv se incluyen a los nios pequeos, las The First American de 68 aos, las mujeres embarazadas y las personas con Armed forces training and education officer, como enfermedades cardacas, pulmonares o renales, trastornos del [place], o que tienen un sistema inmunitario debilitado. La vacuna contra la gripe es especialmente importante para estas personas y para todos los que estn en contacto directo con ellas. La gripe tambin puede causar neumona y [place] afecciones existentes. En los nios, puede provocar diarrea y convulsiones. Cada ao, miles de Goldman Sachs Estados [place] debido a la gripe, y muchas ms deben ser hospitalizadas. La vacuna contra la gripe es la mejor proteccin contra la gripe y sus complicaciones. Tambin ayuda a prevenir el contagio de la gripe de Ardelia Mems persona a Theatre manager. 2. Vacunas antigripales inactivadas y recombinantes Recibe una vacuna inyectable contra la gripe que es una vacuna "inactivada" o "recombinante". Estas vacunas no contienen  ningn virus de la gripe vivo. Se administran en forma de inyeccin con [place] y se las conoce como "vacuna antigripal".  Otro tipo de vacuna antigripal de virus vivos, atenuados (debilitados), se aplica en forma de aerosol en las fosas nasales. Esta vacuna se describe en el apartado Declaracin de informacin sobre las vacunas. Se recomienda la aplicacin de la vacuna contra la gripe todos los [place]. Algunos nios de seis meses a ocho aos de edad podran necesitar dos dosis durante un ao. Los virus de la gripe [place] constantemente. Cada ao, la vacuna contra la gripe se actualiza para proteger contra los tres o cuatro virus que tienen ms probabilidades de causar la enfermedad ese ao. Aunque la  vacuna no puede prevenir todos los casos de gripe, es la mejor defensa contra la enfermedad.  La vacuna comienza a tener Motorola despus de la vacunacin, y la proteccin dura de unos meses a un ao. Muchas veces se confunden con la gripe algunas enfermedades que no son causadas por el virus de la gripe. La vacuna contra la gripe no previene estas enfermedades. Solo puede prevenir la gripe. Algunas de las vacunas contra la gripe inactivada contienen una cantidad muy pequea de un conservante a base de mercurio llamado timerosal. Algunos estudios han demostrado que el timerosal en las vacunas no es perjudicial, pero se dispone de vacunas contra la gripe que no contienen el conservante. 3. Algunas personas no deben recibir esta vacuna Informe a la persona que le aplica la vacuna:  Si tiene [place] graves, potencialmente mortales. Si alguna vez tuvo una reaccin alrgica potencialmente mortal despus de Ardelia Mems dosis de la vacuna contra la gripe, o tuvo una alergia grave a cualquiera de los componentes de Reliance, incluida, por ejemplo, una alergia a la gelatina, a los antibiticos o a los [place], es posible que se le recomiende no recibir una dosis. La State Farm de los tipos de vacuna  contra la gripe, pero no todos, contienen una pequea cantidad de protena de [place].  Si alguna vez ha sufrido el sndrome de Curator (una enfermedad paralizante grave tambin llamada GBS). Algunas personas con antecedentes de GBS no deben recibir esta vacuna. Debe hablarlo con su mdico.  Si no se siente bien. Por lo general, la vacuna contra la gripe no causa ningn problema si tiene una enfermedad leve, pero le podran aconsejar que espere hasta sentirse mejor. Debe volver cuando se sienta mejor. 4. Riesgos de [place] reaccin a la vacuna Con la vacuna, como con cualquier Hallibur[employer] Company, existe la posibilidad de sufrir efectos secundarios. Suelen ser leves y desaparecen por s solos. Problemas que podran ocurrir despus de cualquier vacuna:  Episodios leves de desmayo pueden presentarse despus de cualquier procedimiento mdico, incluso despus de recibir [employer]. Si permanece sentado o recostado durante 15 minutos puede ayudar a Merrill Lynch y las lesiones causadas por las cadas. Informe al mdico si se siente mareado, tiene cambios en la visin o zumbidos en los odos.  Con muy poca frecuencia, despus de Augusto Gamble, puede presentarse dolor intenso en el hombro y [place] reduccin en el rango de movimiento del brazo donde se la aplic.  Las Chief of Staff graves a Augusto Gamble son poco frecuentes; se estima una proporcin de menos de una en un milln de dosis. Si aparecen, generalmente es despus de algunos minutos o algunas horas despus de la aplicacin de la vacuna. Problemas leves despus de recibir la vacuna de la gripe inactivada:  Dolor, enrojecimiento o Estate agent en el que le aplicaron la vacuna.  [place].  Dolor, enrojecimiento o [employer] Energy ojos.  Tos.  Cristy Hilts.  Dolores.  Dolor de [place].  Picazn.  Fatiga. Si estos problemas ocurren, en general comienzan poco despus de vacunarse y [place]. Problemas moderados despus de  recibir la vacuna contra la gripe inactivada:  Los nios que reciben la vacuna contra la gripe inactivada y TEFL teacher antineumoccica (PCV13) al mismo tiempo, pueden tener un mayor riesgo de sufrir convulsiones causadas por fiebre. Consulte a su mdico para obtener ms informacin. Informe a su mdico si un nio que est recibiendo la vacuna contra la gripe ha tenido una convulsin. La vacuna contra  la gripe inactivada no contiene el virus vivo; por lo tanto, no puede enfermarse por recibir S[employer] Corporation. Al igual que con cualquier Halliburton Company, existe una probabilidad remota de que una vacuna cause una lesin grave o la [place]. Se controla permanentemente la seguridad de las vacunas. Para obtener ms informacin, visite: http://www.aguilar.org/ 5. Qu pasa si hay una reaccin grave? A qu signos debo estar atento?  Observe todo lo que le preocupe, como signos de una reaccin alrgica grave, fiebre muy alta o cambios en el comportamiento. Los signos de Nurse, mental health grave pueden incluir ronchas, hinchazn de la cara y la garganta, dificultad para respirar, latidos cardacos acelerados, mareos y debilidad. Pueden comenzar entre unos pocos minutos y algunas horas despus de la vacunacin. Qu debo hacer?  Si cree que se trata de Nurse, mental health grave o de otra emergencia que no puede esperar, llame al 911 o lleve a la persona al hospital ms cercano. Sino, llame a su mdico.  Despus, la reaccin debe informarse al Sistema de Informacin sobre Efectos Adversos de las [place] (Vaccine Adverse Event Reporting System, VAERS). El mdico debe presentar este informe, o bien puede hacerlo usted mismo a travs del sitio web de VAERS, en [email or web address], o llamando al [phone]. VAERSno brinda recomendaciones mdicas. 6. [place] Compensacin de Daos por [place] de Compensacin de Daos por Clinical biochemist (National Vaccine Injury Compensation Program, VICP) es  un programa federal que fue creado para Patent examiner a las personas que puedan haber sufrido daos al recibir ciertas vacunas. Aquellas personas que consideren que han sufrido un dao como consecuencia de una vacuna y quieren saber ms acerca del programa y cmo presentar Raechel Chute, pueden llamar al [phone] o visitar su sitio web en [email or web address]. Hay un lmite de tiempo para presentar un reclamo de compensacin. 7. Cmo puedo obtener ms informacin?  Pregntele a su mdico  Comunquese con el servicio de salud de su localidad o su estado.  Comunquese con los Centros para Building surveyor y la Prevencin de Probation officer for Disease Control and Prevention , CDC).  Llame al [phone] (1-800-CDC-INFO), o  visite la pgina web de CDC en https://gibson.com/. Declaracin de informacin sobre la vacuna (provisional) de los CDC Vacuna antigripal inactivada (12/13/2012) Document Released: 07/10/2008 Document Revised: 08/28/2013 ExitCare Patient Information 2015 [place]. This information is not intended to replace advice given to you by your health care provider. Make sure you discuss any questions you have with your health care provider.

## 2014-02-05 NOTE — Progress Notes (Signed)
Pain in lower abdomen this am, constant for 2 hours Pt has not provided urine sample today, states she urinated at home and does not feel as if she can go now. Spanish interpreter present during checkin

## 2014-02-21 ENCOUNTER — Encounter (HOSPITAL_COMMUNITY): Payer: Self-pay | Admitting: Emergency Medicine

## 2014-02-21 ENCOUNTER — Observation Stay (HOSPITAL_COMMUNITY)
Admission: EM | Admit: 2014-02-21 | Discharge: 2014-02-23 | Disposition: A | Discharge status: Home / Self Care | Payer: Self-pay | Attending: Obstetrics & Gynecology | Admitting: Obstetrics & Gynecology

## 2014-02-21 ENCOUNTER — Encounter: Payer: Self-pay | Admitting: Obstetrics and Gynecology

## 2014-02-21 DIAGNOSIS — S00212A Abrasion of left eyelid and periocular area, initial encounter: Secondary | ICD-10-CM

## 2014-02-21 DIAGNOSIS — M25551 Pain in right hip: Secondary | ICD-10-CM

## 2014-02-21 DIAGNOSIS — O26893 Other specified pregnancy related conditions, third trimester: Secondary | ICD-10-CM

## 2014-02-21 DIAGNOSIS — Z3A32 32 weeks gestation of pregnancy: Secondary | ICD-10-CM

## 2014-02-21 DIAGNOSIS — M542 Cervicalgia: Secondary | ICD-10-CM | POA: Insufficient documentation

## 2014-02-21 DIAGNOSIS — Y92410 Unspecified street and highway as the place of occurrence of the external cause: Secondary | ICD-10-CM | POA: Insufficient documentation

## 2014-02-21 DIAGNOSIS — O9A219 Injury, poisoning and certain other consequences of external causes complicating pregnancy, unspecified trimester: Secondary | ICD-10-CM

## 2014-02-21 LAB — COMPREHENSIVE METABOLIC PANEL
ALBUMIN: 2.7 g/dL — AB (ref 3.5–5.2)
ALT: 18 U/L (ref 0–35)
ANION GAP: 14 (ref 5–15)
AST: 23 U/L (ref 0–37)
Alkaline Phosphatase: 156 U/L — ABNORMAL HIGH (ref 39–117)
BUN: 9 mg/dL (ref 6–23)
CO2: 21 mEq/L (ref 19–32)
CREATININE: 0.52 mg/dL (ref 0.50–1.10)
Calcium: 8.4 mg/dL (ref 8.4–10.5)
Chloride: 101 mEq/L (ref 96–112)
GFR calc Af Amer: >90 mL/min (ref >90)
GFR calc non Af Amer: >90 mL/min (ref >90)
Glucose, Bld: 79 mg/dL (ref 70–99)
POTASSIUM: 3.7 meq/L (ref 3.7–5.3)
Sodium: 136 mEq/L — ABNORMAL LOW (ref 137–147)
Total Bilirubin: <0.2 mg/dL — ABNORMAL LOW (ref 0.3–1.2)
Total Protein: 7 g/dL (ref 6.0–8.3)

## 2014-02-21 LAB — URINALYSIS, ROUTINE W REFLEX MICROSCOPIC
Bilirubin Urine: NEGATIVE
Glucose, UA: NEGATIVE mg/dL
Hgb urine dipstick: NEGATIVE
Ketones, ur: NEGATIVE mg/dL
LEUKOCYTES UA: NEGATIVE
NITRITE: NEGATIVE
PH: 6.5 (ref 5.0–8.0)
Protein, ur: NEGATIVE mg/dL
SPECIFIC GRAVITY, URINE: 1.015 (ref 1.005–1.030)
Urobilinogen, UA: 0.2 mg/dL (ref 0.0–1.0)

## 2014-02-21 LAB — CBC WITH DIFFERENTIAL/PLATELET
BASOS PCT: 0 % (ref 0–1)
Basophils Absolute: 0 10*3/uL (ref 0.0–0.1)
Eosinophils Absolute: 0.1 10*3/uL (ref 0.0–0.7)
Eosinophils Relative: 1 % (ref 0–5)
HCT: 31.8 % — ABNORMAL LOW (ref 36.0–46.0)
HEMOGLOBIN: 10.1 g/dL — AB (ref 12.0–15.0)
Lymphocytes Relative: 24 % (ref 12–46)
Lymphs Abs: 1.5 10*3/uL (ref 0.7–4.0)
MCH: 24.9 pg — AB (ref 26.0–34.0)
MCHC: 31.8 g/dL (ref 30.0–36.0)
MCV: 78.5 fL (ref 78.0–100.0)
MONO ABS: 0.5 10*3/uL (ref 0.1–1.0)
MONOS PCT: 9 % (ref 3–12)
Neutro Abs: 4 10*3/uL (ref 1.7–7.7)
Neutrophils Relative %: 66 % (ref 43–77)
Platelets: 187 10*3/uL (ref 150–400)
RBC: 4.05 MIL/uL (ref 3.87–5.11)
RDW: 14.5 % (ref 11.5–15.5)
WBC: 6.1 10*3/uL (ref 4.0–10.5)

## 2014-02-21 LAB — TYPE AND SCREEN
ABO/RH(D): O POS
Antibody Screen: NEGATIVE

## 2014-02-21 LAB — CBC
HEMATOCRIT: 30.7 % — AB (ref 36.0–46.0)
Hemoglobin: 9.8 g/dL — ABNORMAL LOW (ref 12.0–15.0)
MCH: 25.2 pg — AB (ref 26.0–34.0)
MCHC: 31.9 g/dL (ref 30.0–36.0)
MCV: 78.9 fL (ref 78.0–100.0)
Platelets: 175 10*3/uL (ref 150–400)
RBC: 3.89 MIL/uL (ref 3.87–5.11)
RDW: 14.7 % (ref 11.5–15.5)
WBC: 6.7 10*3/uL (ref 4.0–10.5)

## 2014-02-21 MED ORDER — DOCUSATE SODIUM 100 MG PO CAPS
100.0000 mg | ORAL_CAPSULE | Freq: Every day | ORAL | Status: DC
  Administered 2014-02-22: 100 mg via ORAL
  Filled 2014-02-21: qty 1

## 2014-02-21 MED ORDER — PRENATAL MULTIVITAMIN CH
1.0000 | ORAL_TABLET | Freq: Every day | ORAL | Status: DC
  Administered 2014-02-22: 1 via ORAL
  Filled 2014-02-21: qty 1

## 2014-02-21 MED ORDER — CYCLOBENZAPRINE HCL 5 MG PO TABS
5.0000 mg | ORAL_TABLET | Freq: Three times a day (TID) | ORAL | Status: DC | PRN
  Administered 2014-02-21 – 2014-02-22 (×5): 5 mg via ORAL
  Filled 2014-02-21: qty 1
  Filled 2014-02-21: qty 0.5
  Filled 2014-02-21 (×2): qty 1
  Filled 2014-02-21: qty 0.5

## 2014-02-21 MED ORDER — CALCIUM CARBONATE ANTACID 500 MG PO CHEW: 2.0000 | CHEWABLE_TABLET | ORAL | Status: DC | PRN

## 2014-02-21 MED ORDER — SODIUM CHLORIDE 0.9 % IV BOLUS (SEPSIS)
1000.0000 mL | Freq: Once | INTRAVENOUS | Status: AC
  Administered 2014-02-21: 1000 mL via INTRAVENOUS

## 2014-02-21 MED ORDER — NIFEDIPINE 10 MG PO CAPS
20.0000 mg | ORAL_CAPSULE | Freq: Once | ORAL | Status: AC
  Administered 2014-02-21: 20 mg via ORAL
  Filled 2014-02-21: qty 2

## 2014-02-21 MED ORDER — NIFEDIPINE 10 MG PO CAPS
10.0000 mg | ORAL_CAPSULE | Freq: Four times a day (QID) | ORAL | Status: DC
  Administered 2014-02-21: 10 mg via ORAL
  Filled 2014-02-21: qty 1

## 2014-02-21 MED ORDER — ACETAMINOPHEN 325 MG PO TABS: 650.0000 mg | ORAL_TABLET | ORAL | Status: DC | PRN

## 2014-02-21 MED ORDER — OXYCODONE-ACETAMINOPHEN 5-325 MG PO TABS
1.0000 | ORAL_TABLET | ORAL | Status: DC | PRN
  Administered 2014-02-21 – 2014-02-23 (×5): 1 via ORAL
  Filled 2014-02-21 (×5): qty 1

## 2014-02-21 MED ORDER — ZOLPIDEM TARTRATE 5 MG PO TABS
5.0000 mg | ORAL_TABLET | Freq: Every evening | ORAL | Status: DC | PRN
  Administered 2014-02-22: 5 mg via ORAL
  Filled 2014-02-21: qty 1

## 2014-02-21 MED ORDER — CYCLOBENZAPRINE HCL 5 MG PO TABS: 5.0000 mg | ORAL_TABLET | Freq: Three times a day (TID) | ORAL | Status: DC | PRN

## 2014-02-21 MED ORDER — ACETAMINOPHEN 325 MG PO TABS
650.0000 mg | ORAL_TABLET | Freq: Once | ORAL | Status: AC
  Administered 2014-02-21: 650 mg via ORAL
  Filled 2014-02-21: qty 2
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
## 2014-02-22 MED ORDER — SODIUM CHLORIDE 0.9 % IJ SOLN: 3.0000 mL | Freq: Two times a day (BID) | INTRAMUSCULAR | Status: DC

## 2014-02-22 MED ORDER — BETAMETHASONE SOD PHOS & ACET 6 (3-3) MG/ML IJ SUSP
12.0000 mg | INTRAMUSCULAR | Status: AC
  Administered 2014-02-22 – 2014-02-23 (×2): 12 mg via INTRAMUSCULAR
  Filled 2014-02-22 (×2): qty 2

## 2014-02-22 NOTE — Progress Notes (Signed)
I was present for the exam and agree with above.  [place], CNM 02/22/2014 2:30 PM
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
## 2014-02-22 NOTE — Progress Notes (Signed)
At bedside to discuss POC, pain management, ordering food... Pt denies any further needs at this time.

## 2014-02-22 NOTE — Progress Notes (Signed)
Pt back in bed from bathroom

## 2014-02-22 NOTE — H&P (Signed)
Attestation of Attending Supervision of Fellow: Evaluation and management procedures were performed by the Fellow under my supervision and collaboration.  I have reviewed the Fellow's note and chart, and I agree with the management and plan.    

## 2014-02-22 NOTE — Progress Notes (Signed)
I stopped by patient's room to check on her need, by Juliann Mule, Interpreter

## 2014-02-23 ENCOUNTER — Telehealth (HOSPITAL_COMMUNITY): Payer: Self-pay | Admitting: *Deleted

## 2014-02-23 MED ORDER — OXYCODONE-ACETAMINOPHEN 5-325 MG PO TABS: 1.0000 | ORAL_TABLET | ORAL | Status: DC | PRN

## 2014-02-23 MED ORDER — CYCLOBENZAPRINE HCL 5 MG PO TABS: 5.0000 mg | ORAL_TABLET | Freq: Three times a day (TID) | ORAL | Status: DC | PRN

## 2014-02-23 NOTE — Progress Notes (Signed)
Interpreter here to assist with explanation of BMZ dosing;  Patient and fob questions asked and answered by CNM.  Pt agreed to injection

## 2014-02-23 NOTE — Progress Notes (Signed)
Requested interpreter

## 2014-02-23 NOTE — Progress Notes (Signed)
Pt eating breakfast will return to administer BMZ when finished eating

## 2014-02-23 NOTE — Progress Notes (Signed)
Discharge instructions reviewed with interpreter present; poc discussed, will administer BMZ injection at 1000
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
## 2014-02-23 NOTE — Progress Notes (Signed)
Pt requesting to speak to MD regarding BMZ injection
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
## 2014-02-26 ENCOUNTER — Encounter (HOSPITAL_COMMUNITY): Payer: Self-pay | Admitting: Emergency Medicine

## 2014-02-27 ENCOUNTER — Encounter: Payer: Self-pay | Admitting: Advanced Practice Midwife

## 2014-02-28 ENCOUNTER — Inpatient Hospital Stay (HOSPITAL_COMMUNITY)
Admission: AD | Admit: 2014-02-28 | Discharge: 2014-02-28 | Disposition: A | Discharge status: Home / Self Care | Payer: No Typology Code available for payment source | Source: Ambulatory Visit | Attending: Obstetrics & Gynecology | Admitting: Obstetrics & Gynecology

## 2014-02-28 ENCOUNTER — Encounter (HOSPITAL_COMMUNITY): Payer: Self-pay | Admitting: *Deleted

## 2014-02-28 DIAGNOSIS — Z3A33 33 weeks gestation of pregnancy: Secondary | ICD-10-CM | POA: Insufficient documentation

## 2014-02-28 DIAGNOSIS — R109 Unspecified abdominal pain: Secondary | ICD-10-CM | POA: Insufficient documentation

## 2014-02-28 DIAGNOSIS — O9989 Other specified diseases and conditions complicating pregnancy, childbirth and the puerperium: Secondary | ICD-10-CM | POA: Insufficient documentation

## 2014-02-28 DIAGNOSIS — M25559 Pain in unspecified hip: Secondary | ICD-10-CM | POA: Insufficient documentation

## 2014-02-28 LAB — WET PREP, GENITAL
Clue Cells Wet Prep HPF POC: NONE SEEN
Trich, Wet Prep: NONE SEEN
YEAST WET PREP: NONE SEEN

## 2014-02-28 LAB — URINALYSIS, ROUTINE W REFLEX MICROSCOPIC
Bilirubin Urine: NEGATIVE
Glucose, UA: NEGATIVE mg/dL
Hgb urine dipstick: NEGATIVE
Ketones, ur: NEGATIVE mg/dL
Leukocytes, UA: NEGATIVE
Nitrite: NEGATIVE
Protein, ur: NEGATIVE mg/dL
SPECIFIC GRAVITY, URINE: 1.015 (ref 1.005–1.030)
UROBILINOGEN UA: 0.2 mg/dL (ref 0.0–1.0)
pH: 7 (ref 5.0–8.0)

## 2014-02-28 MED ORDER — CYCLOBENZAPRINE HCL 5 MG PO TABS: 5.0000 mg | ORAL_TABLET | Freq: Three times a day (TID) | ORAL | Status: DC | PRN

## 2014-02-28 MED ORDER — OXYCODONE-ACETAMINOPHEN 5-325 MG PO TABS: 1.0000 | ORAL_TABLET | ORAL | Status: DC | PRN

## 2014-02-28 MED ORDER — CYCLOBENZAPRINE HCL 5 MG PO TABS
5.0000 mg | ORAL_TABLET | Freq: Once | ORAL | Status: AC
  Administered 2014-02-28: 5 mg via ORAL
  Filled 2014-02-28: qty 1

## 2014-02-28 MED ORDER — OXYCODONE-ACETAMINOPHEN 5-325 MG PO TABS
2.0000 | ORAL_TABLET | Freq: Once | ORAL | Status: AC
Start: 2014-02-28 — End: 2014-02-28
  Administered 2014-02-28: 2 via ORAL
  Filled 2014-02-28: qty 2
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
## 2014-02-28 NOTE — Discharge Instructions (Signed)
Tercer trimestre del embarazo  (Third Trimester of Pregnancy)  El tercer trimestre del embarazo abarca desde la semana 29 hasta la semana 42, desde el 7 mes hasta el 9. En este trimestre el beb (feto) se desarrolla muy rpidamente. Hacia el final del noveno mes, el beb que an no ha nacido mide alrededor de 20 pulgadas (45 cm) de largo. Y pesa entre 6 y 10 libras (2,700 y 4,500 kg).  CUIDADOS EN EL HOGAR   Evite fumar, consumir hierbas y beber alcohol. Evite los frmacos que no apruebe el mdico.  Slo tome los medicamentos que le haya indicado su mdico. Algunos medicamentos son seguros para tomar durante el embarazo y otros no lo son.  Haga ejercicios slo como le indique el mdico. Deje de hacer ejercicios si comienza a tener clicos.  Haga comidas regulares y sanas.  Use un sostn que le brinde buen soporte si sus mamas estn sensibles.  No utilice la baera con agua caliente, baos turcos y saunas.  Colquese el cinturn de seguridad cuando conduzca.  Evite comer carne cruda y el contacto con los utensilios y desperdicios de los gatos.  Tome las vitaminas indicadas para la etapa prenatal.  Trate de tomar medicamentos para mover el intestino (laxantes) segn lo necesario y si su mdico la autoriza. Consuma ms fibra comiendo frutas y vegetales frescos y granos enteros. Beba gran cantidad de lquido para mantener el pis (orina) de tono claro o amarillo plido.  Tome baos de agua tibia (baos de asiento) para calmar el dolor o las molestias causadas por las hemorroides. Use una crema para las hemorroides si el mdico la autoriza.  Si tiene venas hinchadas y abultadas (venas varicosas), use medias de soporte. Eleve (levante) los pies durante 15 minutos, 3 o 4 veces por da. Limite el consumo de sal en su dieta.  Evite levantar objetos pesados, usar tacones altos y sintese derecha.  Descanse con las piernas elevadas si tiene calambres o dolor de cintura.  Visite a su dentista  si no lo ha hecho durante el embarazo. Use un cepillo de dientes blando para higienizarse los dientes. Use suavemente el hilo dental.  Puede tener sexo (relaciones sexuales) siempre que el mdico la autorice.  No viaje por largas distancias si puede evitarlo. Slo hgalo con la aprobacin de su mdico.  Haga el curso pre parto.  Practique conducir hasta el hospital.  Prepare el bolso que llevar.  Prepare la habitacin del beb.  Concurra a los controles mdicos. SOLICITE AYUDA SI:   No est segura si est en trabajo de parto o ha roto la bolsa de aguas.  Tiene mareos.  Siente clicos intensos o presin en la zona baja del vientre (abdomen).  Siente un dolor persistente en la zona del vientre.  Tiene malestar estomacal (nuseas), devuelve (vomita), o tiene deposiciones acuosas (diarrea).  Advierte un olor ftido que proviene de la vagina.  Siente dolor al hacer pis (orinar). SOLICITE AYUDA DE INMEDIATO SI:   Tiene fiebre.  Pierde lquido o sangre por la vagina.  Tiene sangrando o pequeas prdidas vaginales.  Siente dolor intenso o clicos en el abdomen.  Sube o baja de peso rpidamente.  Tiene dificultad para respirar o siente dolor en el pecho.  Sbitamente se le hinchan el rostro, las manos, los tobillos, los pies o las piernas.  No ha sentido los movimientos del beb durante una hora.  Siente un dolor de cabeza intenso que no se alivia con medicamentos.  Su visin se modifica. Document   Released: 12/14/2012 [employer] Patient Information 2015 [employer]. This information is not intended to replace advice given to you by your health care provider. Make sure you discuss any questions you have with your health care provider.  

## 2014-03-13 ENCOUNTER — Ambulatory Visit (INDEPENDENT_AMBULATORY_CARE_PROVIDER_SITE_OTHER): Payer: Self-pay | Admitting: Advanced Practice Midwife

## 2014-03-13 ENCOUNTER — Other Ambulatory Visit: Payer: Self-pay | Admitting: Advanced Practice Midwife

## 2014-03-13 VITALS — BP 96/71 | HR 82 | Temp 98.2°F | Wt 149.8 lb

## 2014-03-13 DIAGNOSIS — Z3483 Encounter for supervision of other normal pregnancy, third trimester: Secondary | ICD-10-CM

## 2014-03-13 LAB — OB RESULTS CONSOLE GC/CHLAMYDIA
CHLAMYDIA, DNA PROBE: NEGATIVE
GC PROBE AMP, GENITAL: NEGATIVE

## 2014-03-13 LAB — OB RESULTS CONSOLE GBS: GBS: NEGATIVE

## 2014-03-13 NOTE — Progress Notes (Signed)
Doing well, and Flexeril helps but afraid to take it. Reassured safe. GBS and cultures done today as next week is holiday.

## 2014-03-13 NOTE — Progress Notes (Signed)
C/o of intermittent pelvic pressure and cramping. C/o of left leg numbness-- reports "it falls asleep often."  Rosaland Lao used as interpreter for this encounter.  Declines tdap and flu today-- tdap informations sheet given-- to decide at next visit.

## 2014-03-13 NOTE — Patient Instructions (Signed)
Tercer trimestre de embarazo (Third Trimester of Pregnancy) El tercer trimestre va desde la semana29 hasta la 42, desde el sptimo hasta el noveno mes, y es la poca en la que el feto crece ms rpidamente. Hacia el final del noveno mes, el feto mide alrededor de 20pulgadas (45cm) de largo y pesa entre 6 y 10 libras (2,700 y 4,500kg).  CAMBIOS EN EL ORGANISMO Su organismo atraviesa por muchos cambios durante el embarazo, y estos varan de una mujer a otra.   Seguir aumentando de peso. Es de esperar que aumente entre 25 y 35libras (11 y 16kg) hacia el final del embarazo.  Podrn aparecer las primeras estras en las caderas, el abdomen y las mamas.  Puede tener necesidad de orinar con ms frecuencia porque el feto baja hacia la pelvis y ejerce presin sobre la vejiga.  Debido al embarazo podr sentir acidez estomacal con frecuencia.  Puede estar estreida, ya que ciertas hormonas enlentecen los movimientos de los msculos que empujan los desechos a travs de los intestinos.  Pueden aparecer hemorroides o abultarse e hincharse las venas (venas varicosas).  Puede sentir dolor plvico debido al aumento de peso y a que las hormonas del embarazo relajan las articulaciones entre los huesos de la pelvis. El dolor de espalda puede ser consecuencia de la sobrecarga de los msculos que soportan la postura.  Tal vez haya cambios en el cabello que pueden incluir su engrosamiento, crecimiento rpido y cambios en la textura. Adems, a algunas mujeres se les cae el cabello durante o despus del embarazo, o tienen el cabello seco o fino. Lo ms probable es que el cabello se le normalice despus del nacimiento del beb.  Las mamas seguirn creciendo y le dolern. A veces, puede haber una secrecin amarilla de las mamas llamada calostro.  El ombligo puede salir hacia afuera.  Puede sentir que le falta el aire debido a que se expande el tero.  Puede notar que el feto "baja" o lo siente ms bajo, en el  abdomen.  Puede tener una prdida de secrecin mucosa con sangre. Esto suele ocurrir en el trmino de unos pocos das a una semana antes de que comience el trabajo de parto.  El cuello del tero se vuelve delgado y blando (se borra) cerca de la fecha de parto. QU DEBE ESPERAR EN LOS EXMENES PRENATALES  Le harn exmenes prenatales cada 2semanas hasta la semana36. A partir de ese momento le harn exmenes semanales. Durante una visita prenatal de rutina:  La pesarn para asegurarse de que usted y el feto estn creciendo normalmente.  Le tomarn la presin arterial.  Le medirn el abdomen para controlar el desarrollo del beb.  Se escucharn los latidos cardacos fetales.  Se evaluarn los resultados de los estudios solicitados en visitas anteriores.  Le revisarn el cuello del tero cuando est prxima la fecha de parto para controlar si este se ha borrado. Alrededor de la semana36, el mdico le revisar el cuello del tero. Al mismo tiempo, realizar un anlisis de las secreciones del tejido vaginal. Este examen es para determinar si hay un tipo de bacteria, estreptococo Grupo B. El mdico le explicar esto con ms detalle. El mdico puede preguntarle lo siguiente:  Cmo le gustara que fuera el parto.  Cmo se siente.  Si siente los movimientos del beb.  Si ha tenido sntomas anormales, como prdida de lquido, sangrado, dolores de cabeza intensos o clicos abdominales.  Si tiene alguna pregunta. Otros exmenes o estudios de deteccin que pueden realizarse   durante el tercer trimestre incluyen lo siguiente:  Anlisis de sangre para controlar las concentraciones de hierro (anemia).  Controles fetales para determinar su salud, nivel de actividad y crecimiento. Si tiene alguna enfermedad o hay problemas durante el embarazo, le harn estudios. FALSO TRABAJO DE PARTO Es posible que sienta contracciones leves e irregulares que finalmente desaparecen. Se llaman contracciones de  Braxton Hicks o falso trabajo de parto. Las contracciones pueden durar horas, das o incluso semanas, antes de que el verdadero trabajo de parto se inicie. Si las contracciones ocurren a intervalos regulares, se intensifican o se hacen dolorosas, lo mejor es que la revise el mdico.  SIGNOS DE TRABAJO DE PARTO   Clicos de tipo menstrual.  Contracciones cada 5minutos o menos.  Contracciones que comienzan en la parte superior del tero y se extienden hacia abajo, a la zona inferior del abdomen y la espalda.  Sensacin de mayor presin en la pelvis o dolor de espalda.  Una secrecin de mucosidad acuosa o con sangre que sale de la vagina. Si tiene alguno de estos signos antes de la semana37 del embarazo, llame a su mdico de inmediato. Debe concurrir al hospital para que la controlen inmediatamente. INSTRUCCIONES PARA EL CUIDADO EN EL HOGAR   Evite fumar, consumir hierbas, beber alcohol y tomar frmacos que no le hayan recetado. Estas sustancias qumicas afectan la formacin y el desarrollo del beb.  Siga las indicaciones del mdico en relacin con el uso de medicamentos. Durante el embarazo, hay medicamentos que son seguros de tomar y otros que no.  Haga actividad fsica solo en la forma indicada por el mdico. Sentir clicos uterinos es un buen signo para detener la actividad fsica.  Contine comiendo alimentos que sanos con regularidad.  Use un sostn que le brinde buen soporte si le duelen las mamas.  No se d baos de inmersin en agua caliente, baos turcos ni saunas.  Colquese el cinturn de seguridad cuando conduzca.  No coma carne cruda ni queso sin cocinar; evite el contacto con las bandejas sanitarias de los gatos y la tierra que estos animales usan. Estos elementos contienen grmenes que pueden causar defectos congnitos en el beb.  Tome las vitaminas prenatales.  Si est estreida, pruebe un laxante suave (si el mdico lo autoriza). Consuma ms alimentos ricos en  fibra, como vegetales y frutas frescos y cereales integrales. Beba gran cantidad de lquido para mantener la orina de tono claro o color amarillo plido.  Dese baos de asiento con agua tibia para aliviar el dolor o las molestias causadas por las hemorroides. Use una crema para las hemorroides si el mdico la autoriza.  Si tiene venas varicosas, use medias de descanso. Eleve los pies durante 15minutos, 3 o 4veces por da. Limite la cantidad de sal en su dieta.  Evite levantar objetos pesados, use zapatos de tacones bajos y mantenga una buena postura.  Descanse con las piernas elevadas si tiene calambres o dolor de cintura.  Visite a su dentista si no lo ha hecho durante el embarazo. Use un cepillo de dientes blando para higienizarse los dientes y psese el hilo dental con suavidad.  Puede seguir manteniendo relaciones sexuales, a menos que el mdico le indique lo contrario.  No haga viajes largos excepto que sea absolutamente necesario y solo con la autorizacin del mdico.  Tome clases prenatales para entender, practicar y hacer preguntas sobre el trabajo de parto y el parto.  Haga un ensayo de la partida al hospital.  Prepare el bolso que   llevar al hospital.  Prepare la habitacin del beb.  Concurra a todas las visitas prenatales segn las indicaciones de su mdico. SOLICITE ATENCIN MDICA SI:  No est segura de que est en trabajo de parto o de que ha roto la bolsa de las aguas.  Tiene mareos.  Siente clicos leves, presin en la pelvis o dolor persistente en el abdomen.  Tiene nuseas, vmitos o diarrea persistentes.  Tiene secrecin vaginal con mal olor.  Siente dolor al orinar. SOLICITE ATENCIN MDICA DE INMEDIATO SI:   Tiene fiebre.  Tiene una prdida de lquido por la vagina.  Tiene sangrado o pequeas prdidas vaginales.  Siente dolor intenso o clicos en el abdomen.  Sube o baja de peso rpidamente.  Tiene dificultad para respirar y siente dolor de  pecho.  Sbitamente se le hinchan mucho el rostro, las manos, los tobillos, los pies o las piernas.  No ha sentido los movimientos del beb durante una hora.  Siente un dolor de cabeza intenso que no se alivia con medicamentos.  Hay cambios en la visin. Document Released: 01/21/2005 Document Revised: 04/18/2013 [employer] Patient Information 2015 [employer]. This information is not intended to replace advice given to you by your health care provider. Make sure you discuss any questions you have with your health care provider.  

## 2014-03-13 NOTE — Addendum Note (Signed)
Addended by: Seabron Spates on: 03/13/2014 08:58 AM   Modules accepted: Orders

## 2014-03-14 LAB — GC/CHLAMYDIA PROBE AMP
CT Probe RNA: NEGATIVE
GC Probe RNA: NEGATIVE

## 2014-03-15 LAB — CULTURE, BETA STREP (GROUP B ONLY)

## 2014-03-27 ENCOUNTER — Ambulatory Visit (INDEPENDENT_AMBULATORY_CARE_PROVIDER_SITE_OTHER): Payer: Self-pay | Admitting: Obstetrics and Gynecology

## 2014-03-27 VITALS — BP 101/66 | HR 83 | Temp 98.3°F | Wt 152.3 lb

## 2014-03-27 DIAGNOSIS — R12 Heartburn: Secondary | ICD-10-CM

## 2014-03-27 DIAGNOSIS — O26893 Other specified pregnancy related conditions, third trimester: Secondary | ICD-10-CM

## 2014-03-27 NOTE — Progress Notes (Signed)
Lazarus Salines present as Administrator, sports for encounter.

## 2014-03-27 NOTE — Patient Instructions (Signed)

## 2014-03-27 NOTE — Progress Notes (Signed)
Nausea and heartburn after drinking coffee this am. Elects to take antiemetic she has at home. Advised Tums, Mylicon and call if not effective. Small frequent meals and stay hydrated. EFW 6#.  Fibroids asymptomatic. Has occasional mild pelvic pressure.  Still declines tdap and flu. Labor precautions.

## 2014-04-04 ENCOUNTER — Encounter: Payer: Self-pay | Admitting: Advanced Practice Midwife

## 2014-04-04 ENCOUNTER — Ambulatory Visit (INDEPENDENT_AMBULATORY_CARE_PROVIDER_SITE_OTHER): Payer: Self-pay | Admitting: Advanced Practice Midwife

## 2014-04-04 VITALS — BP 109/82 | HR 74 | Temp 98.1°F | Wt 155.8 lb

## 2014-04-04 DIAGNOSIS — Z3483 Encounter for supervision of other normal pregnancy, third trimester: Secondary | ICD-10-CM

## 2014-04-04 DIAGNOSIS — O2200 Varicose veins of lower extremity in pregnancy, unspecified trimester: Secondary | ICD-10-CM

## 2014-04-04 NOTE — Progress Notes (Signed)
C/o of pelvic pain and burning around navel.

## 2014-04-04 NOTE — Progress Notes (Signed)
28yo at 38w 4d. Spanish Interpreter used throughout encounter. Denies scheduled contractions, however notes she does experience occasional contractions at night. Complains of vein across abdomen occasionally becoming prominent and burning. 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 Plans to breast feed and pump after delivery. Baby will be seeing [facility]. Discussed signs of labor. No further complaints today. Return in one week.

## 2014-04-04 NOTE — Assessment & Plan Note (Signed)
-   Present on abdomen and occasionally becomes more prominent and burns - Anticipate will improve after delivery

## 2014-04-04 NOTE — Patient Instructions (Addendum)
Parto vaginal (Vaginal Delivery) Durante el parto, el mdico la ayudar a dar a luz a su beb. En elparto vaginal, deber pujar para que el beb salga por la vagina. Sin embargo, antes de que pueda sacar al beb, es necesario que ocurran ciertas cosas. La abertura del tero (cuello del tero) tiene que ablandarse, hacerse ms delgado y abrirse (dilatar) hasta que llegue a 10 cm. Adems, el beb tiene que bajar desde el tero a la vagina. SIGNOS DE TRABAJO DE PARTO  El mdico tendr primero que asegurarse de que usted est en trabajo de parto. Algunos signos son:   Eliminar lo que se llama tapn mucoso antes del inicio del trabajo de parto. Este es una pequea cantidad de mucosidad teida con sangre.  Tener contracciones uterinas regulares y dolorosas.   El tiempo entre las contracciones debe acortarse  Las molestias y el dolor se harn ms intensos gradualmente.  El dolor de las contracciones empeora al caminar y no se alivia con el reposo.   El cuello del tero se hace mas delgado (se borra) y se dilata. ANTES DEL PARTO Una vez que se inicie el trabajo de parto y sea admitida en el hospital o sanatorio, el mdico podr hacer lo siguiente:   Realizar un examen fsico.  Controlar si hay complicaciones relacionadas con el trabajo de parto.  Verificar su presin arterial, temperatura y pulso y la frecuencia cardaca (signos vitales).   Determinar si se ha roto el saco amnitico y cundo ha ocurrido.  Realizar un examen vaginal (utilizando un guante estril y un lubricante) para determinar:  La posicin (presentacin) del beb. El beb se presenta con la cabeza primero (vertex) en el canal de parto (vagina), o estn los pies o las nalgas primero (de nalgas)?  El nivel (estacin) de la cabeza del beb dentro del canal de parto.  El borramiento y la dilatacin del cuello uterino  El monitor fetal electrnico generalmente se coloca sobre el abdomen al llegar. Se utiliza para  controlar las contracciones y la frecuencia cardaca del beb.  Cuando el monitor est en el abdomen (monitor fetal externo), slo toma la frecuencia y la duracin de las contracciones. No informa acerca de la intensidad de las contracciones.  Si el mdico necesita saber exactamente la intensidad de las contracciones o cul es la frecuencia cardaca del beb, colocar un monitor interno en la vagina y el tero. El mdico comentar los riesgos y los beneficios de usar un monitor interno y le pedir autorizacin antes de colocar el dispositivo.  El monitoreo fetal continuo ser necesario si le han aplicado una epidural, si le administran ciertos medicamentos (como oxitocina) y si tiene complicaciones del embarazo o del trabajo de parto.  Podrn colocarle una va intravenosa en una vena del brazo para suministrarle lquidos y medicamentos, si es necesario. TRES ETAPAS DEL TRABAJO DE PARTO Y EL PARTO El trabajo de parto y el parto normales se dividen en tres etapas. Primera etapa Esta etapa comienza cuando comienzan las contracciones regulares y el cuello comienza a borrarse y dilatarse. Finaliza cuando el cuello est completamente abierto (completamente dilatado). La primera etapa es la etapa ms larga del trabajo de parto y puede durar desde 3 horas a 15 horas.  Algunos mtodos estn disponibles para ayudar con el dolor del parto. Usted y su mdico decidirn qu opcin es la mejor para usted. Las opciones incluyen:   Medicamentos narcticos. Estos son medicamentos fuertes que usted puede recibir a travs de una va intravenosa o   como inyeccin en el msculo. Estos medicamentos alivian el dolor pero no hacen que desaparezca completamente.  Epidural. Se administra un medicamento a travs de un tubo delgado que se inserta en la espalda. El medicamento adormece la parte inferior del cuerpo y evita el dolor en esa zona.  Bloqueo paracervical Es una inyeccin de un anestsico en cada lado del cuello  uterino.  Usted podr pedir un parto natural, que implica que no se usen analgsicos ni epidural durante el parto y el trabajo de parto. En cambio, podr tener otro tipo de ayuda como ejercicios respiratorios para hacer frente al dolor. Segunda etapa La segunda etapa del trabajo de parto comienza cuando el cuello se ha dilatado completamente a 10 cm. Contina hasta que usted puja al beb hacia abajo, por el canal de parto, y el beb nace. Esta etapa puede durar slo algunos minutos o algunas horas.  La posicin del la cabeza del beb a medida que pasa por el canal de parto, es informada como un nmero, llamado estacin. Si la cabeza del beb no ha iniciado su descenso, la estacin se describe como que est en menos 3 (-3). Cuando la cabeza del beb est en la estacin cero, est en el medio del canal de parto y se encaja en la pelvis. La estacin en la que se encuentra el beb indica el progreso de la segunda etapa del trabajo de parto.  Cuando el beb nace, el mdico lo sostendr con la cabeza hacia abajo para evitar que el lquido amnitico, el moco y la sangre entren en los pulmones del beb. La boca y la nariz del beb podrn ser succionadas con un pequeo bulbo para retirar todo lquido adicional.  El mdico podr colocar al beb sobre su estmago. Es importante evitar que el beb tome fro. Para hacerlo, el mdico secar al beb, lo colocar directamente sobre su piel, (sin mantas entre usted y el beb) y lo cubrir con mantas secas y tibias.  Se corta el cordn umbilical. Tercera etapa Durante la tercera etapa del trabajo de parto, el mdico sacar la placenta (alumbramiento) y se asegurar de que el sangrado est controlado. La salida de la placenta generalmente demora 5 minutos pero puede tardar hasta 30 minutos. Luego de la salida de la placenta, le darn un medicamento por va intravenosa o inyectable para ayudar a contraer el tero y controlar el sangrado. Si planea amamantar al beb,  puede intentar en este momento Luego de la salida de la placenta, el tero debe contraerse y quedar muy firme. Si el tero no queda firme, el mdico lo masajear. Esto es importante debido a que la contraccin del tero ayuda a cortar el sangrado en el sitio en que la placenta estaba unida al tero. Si el tero no se contrae adecuadamente ni permanece firme, podr causar un sangrado abundante. Si hay mucho sangrado, podrn darle medicamentos para contraer el tero y detener el sangrado.  Document Released: 03/26/2008 Document Revised: 08/28/2013 ExitCare Patient Information 2015 [employer]. This information is not intended to replace advice given to you by your health care provider. Make sure you discuss any questions you have with your health care provider.  

## 2014-04-11 ENCOUNTER — Encounter: Payer: Self-pay | Admitting: Advanced Practice Midwife

## 2014-04-13 ENCOUNTER — Inpatient Hospital Stay (HOSPITAL_COMMUNITY)
Admission: AD | Admit: 2014-04-13 | Discharge: 2014-04-15 | DRG: 775 | Disposition: A | Discharge status: Home / Self Care | Payer: Medicaid Other | Source: Ambulatory Visit | Attending: Obstetrics & Gynecology | Admitting: Obstetrics & Gynecology

## 2014-04-13 ENCOUNTER — Encounter (HOSPITAL_COMMUNITY): Payer: Self-pay

## 2014-04-13 ENCOUNTER — Inpatient Hospital Stay (HOSPITAL_COMMUNITY): Payer: Medicaid Other | Admitting: Anesthesiology

## 2014-04-13 DIAGNOSIS — K219 Gastro-esophageal reflux disease without esophagitis: Secondary | ICD-10-CM | POA: Diagnosis present

## 2014-04-13 DIAGNOSIS — O874 Varicose veins of lower extremity in the puerperium: Secondary | ICD-10-CM | POA: Diagnosis present

## 2014-04-13 DIAGNOSIS — Z3483 Encounter for supervision of other normal pregnancy, third trimester: Secondary | ICD-10-CM

## 2014-04-13 DIAGNOSIS — Z3A39 39 weeks gestation of pregnancy: Secondary | ICD-10-CM | POA: Diagnosis present

## 2014-04-13 DIAGNOSIS — O99343 Other mental disorders complicating pregnancy, third trimester: Secondary | ICD-10-CM | POA: Diagnosis present

## 2014-04-13 DIAGNOSIS — N6019 Diffuse cystic mastopathy of unspecified breast: Secondary | ICD-10-CM

## 2014-04-13 DIAGNOSIS — O99613 Diseases of the digestive system complicating pregnancy, third trimester: Secondary | ICD-10-CM | POA: Diagnosis present

## 2014-04-13 DIAGNOSIS — F329 Major depressive disorder, single episode, unspecified: Secondary | ICD-10-CM | POA: Diagnosis present

## 2014-04-13 DIAGNOSIS — IMO0001 Reserved for inherently not codable concepts without codable children: Secondary | ICD-10-CM

## 2014-04-13 DIAGNOSIS — Z833 Family history of diabetes mellitus: Secondary | ICD-10-CM

## 2014-04-13 DIAGNOSIS — F42 Obsessive-compulsive disorder: Secondary | ICD-10-CM | POA: Diagnosis present

## 2014-04-13 DIAGNOSIS — Z8249 Family history of ischemic heart disease and other diseases of the circulatory system: Secondary | ICD-10-CM

## 2014-04-13 LAB — CBC
HCT: 34.9 % — ABNORMAL LOW (ref 36.0–46.0)
HEMOGLOBIN: 11.1 g/dL — AB (ref 12.0–15.0)
MCH: 24.4 pg — AB (ref 26.0–34.0)
MCHC: 31.8 g/dL (ref 30.0–36.0)
MCV: 76.7 fL — AB (ref 78.0–100.0)
Platelets: 179 10*3/uL (ref 150–400)
RBC: 4.55 MIL/uL (ref 3.87–5.11)
RDW: 16.5 % — ABNORMAL HIGH (ref 11.5–15.5)
WBC: 7.9 10*3/uL (ref 4.0–10.5)

## 2014-04-13 LAB — TYPE AND SCREEN
ABO/RH(D): O POS
Antibody Screen: NEGATIVE

## 2014-04-13 LAB — RPR

## 2014-04-13 LAB — HIV ANTIBODY (ROUTINE TESTING W REFLEX): HIV 1&2 Ab, 4th Generation: NONREACTIVE

## 2014-04-13 MED ORDER — OXYCODONE-ACETAMINOPHEN 5-325 MG PO TABS
1.0000 | ORAL_TABLET | ORAL | Status: DC | PRN
  Administered 2014-04-13 – 2014-04-15 (×5): 1 via ORAL
  Filled 2014-04-13 (×5): qty 1

## 2014-04-13 MED ORDER — TETANUS-DIPHTH-ACELL PERTUSSIS 5-2.5-18.5 LF-MCG/0.5 IM SUSP
0.5000 mL | Freq: Once | INTRAMUSCULAR | Status: DC
Start: 2014-04-14 — End: 2014-04-15

## 2014-04-13 MED ORDER — IBUPROFEN 600 MG PO TABS
600.0000 mg | ORAL_TABLET | Freq: Four times a day (QID) | ORAL | Status: DC
  Administered 2014-04-13 – 2014-04-15 (×8): 600 mg via ORAL
  Filled 2014-04-13 (×8): qty 1

## 2014-04-13 MED ORDER — LIDOCAINE HCL (PF) 1 % IJ SOLN
INTRAMUSCULAR | Status: DC | PRN
  Administered 2014-04-13: 6 mL
  Administered 2014-04-13: 4 mL

## 2014-04-13 MED ORDER — LIDOCAINE HCL (PF) 1 % IJ SOLN
30.0000 mL | INTRAMUSCULAR | Status: DC | PRN
  Filled 2014-04-13: qty 30

## 2014-04-13 MED ORDER — PHENYLEPHRINE 40 MCG/ML (10ML) SYRINGE FOR IV PUSH (FOR BLOOD PRESSURE SUPPORT)
80.0000 ug | PREFILLED_SYRINGE | INTRAVENOUS | Status: DC | PRN
  Filled 2014-04-13: qty 2

## 2014-04-13 MED ORDER — EPHEDRINE 5 MG/ML INJ
10.0000 mg | INTRAVENOUS | Status: DC | PRN
Start: 2014-04-13 — End: 2014-04-13
  Filled 2014-04-13: qty 2

## 2014-04-13 MED ORDER — LANOLIN HYDROUS EX OINT: TOPICAL_OINTMENT | CUTANEOUS | Status: DC | PRN

## 2014-04-13 MED ORDER — ONDANSETRON HCL 4 MG/2ML IJ SOLN: 4.0000 mg | Freq: Four times a day (QID) | INTRAMUSCULAR | Status: DC | PRN

## 2014-04-13 MED ORDER — WITCH HAZEL-GLYCERIN EX PADS: 1.0000 "application " | MEDICATED_PAD | CUTANEOUS | Status: DC | PRN

## 2014-04-13 MED ORDER — FENTANYL 2.5 MCG/ML BUPIVACAINE 1/10 % EPIDURAL INFUSION (WH - ANES)
14.0000 mL/h | INTRAMUSCULAR | Status: DC | PRN
  Administered 2014-04-13: 14 mL/h via EPIDURAL
  Filled 2014-04-13: qty 125

## 2014-04-13 MED ORDER — OXYTOCIN BOLUS FROM INFUSION: 500.0000 mL | INTRAVENOUS | Status: DC

## 2014-04-13 MED ORDER — ZOLPIDEM TARTRATE 5 MG PO TABS: 5.0000 mg | ORAL_TABLET | Freq: Every evening | ORAL | Status: DC | PRN

## 2014-04-13 MED ORDER — CITRIC ACID-SODIUM CITRATE 334-500 MG/5ML PO SOLN: 30.0000 mL | ORAL | Status: DC | PRN

## 2014-04-13 MED ORDER — PRENATAL MULTIVITAMIN CH
1.0000 | ORAL_TABLET | Freq: Every day | ORAL | Status: DC
  Administered 2014-04-14 – 2014-04-15 (×2): 1 via ORAL
  Filled 2014-04-13 (×2): qty 1

## 2014-04-13 MED ORDER — ONDANSETRON HCL 4 MG/2ML IJ SOLN: 4.0000 mg | INTRAMUSCULAR | Status: DC | PRN

## 2014-04-13 MED ORDER — LACTATED RINGERS IV SOLN
INTRAVENOUS | Status: DC
  Administered 2014-04-13: 10:00:00 via INTRAVENOUS

## 2014-04-13 MED ORDER — DIPHENHYDRAMINE HCL 50 MG/ML IJ SOLN: 12.5000 mg | INTRAMUSCULAR | Status: DC | PRN

## 2014-04-13 MED ORDER — FLEET ENEMA 7-19 GM/118ML RE ENEM: 1.0000 | ENEMA | RECTAL | Status: DC | PRN

## 2014-04-13 MED ORDER — DIPHENHYDRAMINE HCL 25 MG PO CAPS: 25.0000 mg | ORAL_CAPSULE | Freq: Four times a day (QID) | ORAL | Status: DC | PRN

## 2014-04-13 MED ORDER — OXYCODONE-ACETAMINOPHEN 5-325 MG PO TABS: 2.0000 | ORAL_TABLET | ORAL | Status: DC | PRN

## 2014-04-13 MED ORDER — FENTANYL 2.5 MCG/ML BUPIVACAINE 1/10 % EPIDURAL INFUSION (WH - ANES): 14.0000 mL/h | INTRAMUSCULAR | Status: DC | PRN

## 2014-04-13 MED ORDER — DIBUCAINE 1 % RE OINT: 1.0000 "application " | TOPICAL_OINTMENT | RECTAL | Status: DC | PRN

## 2014-04-13 MED ORDER — EPHEDRINE 5 MG/ML INJ
10.0000 mg | INTRAVENOUS | Status: DC | PRN
  Filled 2014-04-13: qty 2

## 2014-04-13 MED ORDER — ACETAMINOPHEN 325 MG PO TABS
650.0000 mg | ORAL_TABLET | Freq: Once | ORAL | Status: AC
  Administered 2014-04-13: 650 mg via ORAL
  Filled 2014-04-13: qty 2

## 2014-04-13 MED ORDER — ACETAMINOPHEN 325 MG PO TABS: 650.0000 mg | ORAL_TABLET | ORAL | Status: DC | PRN

## 2014-04-13 MED ORDER — BENZOCAINE-MENTHOL 20-0.5 % EX AERO
1.0000 "application " | INHALATION_SPRAY | CUTANEOUS | Status: DC | PRN
  Administered 2014-04-13: 1 via TOPICAL
  Filled 2014-04-13: qty 56

## 2014-04-13 MED ORDER — OXYTOCIN 40 UNITS IN LACTATED RINGERS INFUSION - SIMPLE MED
62.5000 mL/h | INTRAVENOUS | Status: DC
  Administered 2014-04-13: 62.5 mL/h via INTRAVENOUS
  Filled 2014-04-13: qty 1000

## 2014-04-13 MED ORDER — SENNOSIDES-DOCUSATE SODIUM 8.6-50 MG PO TABS
2.0000 | ORAL_TABLET | ORAL | Status: DC
  Administered 2014-04-13 – 2014-04-15 (×2): 2 via ORAL
  Filled 2014-04-13 (×2): qty 2

## 2014-04-13 MED ORDER — LACTATED RINGERS IV SOLN: 500.0000 mL | INTRAVENOUS | Status: DC | PRN

## 2014-04-13 MED ORDER — PHENYLEPHRINE 40 MCG/ML (10ML) SYRINGE FOR IV PUSH (FOR BLOOD PRESSURE SUPPORT)
80.0000 ug | PREFILLED_SYRINGE | INTRAVENOUS | Status: DC | PRN
  Filled 2014-04-13: qty 2
  Filled 2014-04-13: qty 10

## 2014-04-13 MED ORDER — ONDANSETRON HCL 4 MG PO TABS: 4.0000 mg | ORAL_TABLET | ORAL | Status: DC | PRN

## 2014-04-13 MED ORDER — SIMETHICONE 80 MG PO CHEW: 80.0000 mg | CHEWABLE_TABLET | ORAL | Status: DC | PRN

## 2014-04-13 MED ORDER — LACTATED RINGERS IV SOLN
500.0000 mL | Freq: Once | INTRAVENOUS | Status: AC
  Administered 2014-04-13: 500 mL via INTRAVENOUS

## 2014-04-13 NOTE — Progress Notes (Signed)
Patient came back from after she was discharged complain with some more pain, she went back to [facility] .I assisted Microbiologist , By Juliann Mule, Interpreter

## 2014-04-13 NOTE — Discharge Instructions (Signed)
KEEP APPOINTMENTS  Parto vaginal (Vaginal Delivery) Durante el parto, el mdico la ayudar a dar a luz a su beb. En elparto vaginal, deber pujar para que el beb salga por la vagina. Sin embargo, antes de que pueda sacar al beb, es necesario que ocurran ciertas cosas. La abertura del tero (cuello del tero) tiene que ablandarse, hacerse ms delgado y abrirse (dilatar) hasta que llegue a 10 cm. Adems, el beb tiene que bajar desde el tero a la vagina. SIGNOS [place] tendr primero que asegurarse de que usted est en [place]. Algunos signos son:   Eliminar lo que se llama tapn mucoso antes del inicio del trabajo de [place]. Este es una pequea cantidad de mucosidad teida con sangre.  Tener contracciones uterinas regulares y dolorosas.   El [employer] las contracciones debe acortarse  Las molestias y Conservation officer, historic buildings se harn ms intensos gradualmente.  El dolor de las contracciones empeora al caminar y no se alivia con el reposo.   El cuello del tero se hace mas delgado (se borra) y se dilata. ANTES DEL PARTO Una vez que se inicie el trabajo de parto y sea admitida en el hospital o sanatorio, el mdico podr hacer lo siguiente:   Optometrist un examen fsico.  Controlar si hay complicaciones relacionadas con Leander Rams de parto.  Verificar su presin arterial, temperatura y pulso y la frecuencia cardaca (signos vitales).   Determinar si se ha roto el saco amnitico y cundo ha ocurrido.  Realizar un examen vaginal (utilizando un guante estril y un lubricante) para determinar:  La posicin (presentacin) del beb. El beb se presenta con la cabeza primero (vertex) en el canal de parto (vagina), o estn los pies o las nalgas primero (de nalgas)?  El nivel (estacin) de la cabeza del beb dentro del canal de parto.  El borramiento y la dilatacin del cuello uterino  El monitor fetal electrnico generalmente se coloca sobre el abdomen al Sports administrator.  Se utiliza para controlar las contracciones y la frecuencia cardaca del beb.  Cuando el monitor est en el abdomen (monitor fetal externo), slo toma la frecuencia y la duracin de las contracciones. No informa acerca de la intensidad de las contracciones.  Si el mdico necesita saber exactamente la intensidad de las contracciones o cul es la frecuencia cardaca del beb, colocar un monitor interno en la vagina y [place]. El mdico [employer] riesgos y los beneficios de usar un monitor interno y le pedir autorizacin antes de Dispensing optician dispositivo.  El monitoreo fetal continuo ser necesario si le han aplicado una epidural, si le administran ciertos medicamentos (como oxitocina) y si tiene complicaciones del [place] o del trabajo de [place].  Podrn colocarle una va intravenosa en una vena del brazo para suministrarle lquidos y medicamentos, si es necesario. TRES ETAPAS DEL [place] parto y el parto normales se dividen en tres etapas. Primera etapa Esta etapa comienza cuando comienzan las contracciones regulares y el cuello comienza a borrarse y dilatarse. Finaliza cuando el cuello est completamente abierto (completamente dilatado). La primera etapa es la etapa ms larga del [place] de parto y puede durar desde 3 horas a 15 horas.  Algunos mtodos estn disponibles para ayudar con el dolor del [place]. Usted y su mdico decidirn qu opcin es la mejor para usted. Las opciones incluyen:   Medicamentos narcticos. Estos son medicamentos fuertes que usted puede recibir a Designer, jewellery de [place]  va intravenosa o como inyeccin en el msculo. Estos medicamentos Financial trader pero no hacen que desaparezca completamente.  Epidural. Se administra un medicamento a travs de un tubo delgado que se inserta en la espalda. El medicamento adormece la parte inferior del cuerpo y evita el dolor en esa zona.  Bloqueo paracervical Es una inyeccin de un anestsico en cada lado  del cuello uterino.  Usted podr pedir un parto natural, que implica que no se usen analgsicos ni epidural durante el parto y el trabajo de parto. En cambio, podr tener otro tipo de ayuda como ejercicios respiratorios para hacer frente al [employer]. Segunda etapa La segunda etapa del trabajo de parto comienza cuando el cuello se ha dilatado completamente a 10 cm. Contina hasta que usted puja al beb hacia abajo, por el canal de [place], y el beb nace. Esta etapa puede durar slo algunos minutos o algunas horas.  La posicin del la [place] del beb a medida que pasa por el canal de parto, es informada como un nmero, llamado estacin. Si la cabeza del beb no ha iniciado su descenso, la estacin se describe como que est en menos 3 (-3). Cuando la cabeza del beb est en la estacin cero, est en el medio del canal de parto y se encaja en la pelvis. La estacin en la que se encuentra el beb indica el progreso de la segunda etapa del [place] de [place].  Cuando el beb nace, el mdico lo sostendr con la cabeza hacia abajo para evitar que el lquido amnitico, el moco y la sangre entren en los pulmones del beb. La boca y la nariz del beb podrn ser succionadas con un pequeo bulbo para retirar todo lquido [place] mdico podr Glass blower/designer al beb sobre su estmago. Es importante evitar que el beb tome fro. Para hacerlo, el mdico secar al beb, lo colocar directamente sobre su piel, (sin mantas entre usted y el beb) y lo cubrir con mantas secas y tibias.  Se corta el cordn umbilical. Tercera etapa Durante la tercera etapa del trabajo de parto, el mdico sacar la placenta (alumbramiento) y se asegurar de que el sangrado est controlado. La salida de la placenta generalmente demora 5 minutos pero puede tardar hasta 30 minutos. Luego de la salida de la placenta, le darn un medicamento por va intravenosa o inyectable para ayudar a Chemical engineer tero y Geneticist, molecular. Si planea amamantar al  beb, puede intentar en este momento Luego de la salida de la placenta, el tero debe contraerse y [place]. Si el tero no queda firme, el mdico lo Community education officer. Esto es importante debido a que la contraccin del tero ayuda a Oceanographer sangrado en el sitio en que la placenta estaba unida al tero. Si el tero no se contrae adecuadamente ni Morgan Jhayla Podgorski, podr causar un sangrado abundante. Si hay mucho sangrado, podrn darle medicamentos para contraer el tero y Sales promotion account executive.  Document Released: 03/26/2008 Document Revised: 08/28/2013 [facility] Patient Information 2015 [place], [place]. This information is not intended to replace advice given to you by your health care provider. Make sure you discuss any questions you have with your health care provider. Contracciones de Physiological scientist (Braxton Hicks Contractions) Durante el [place], pueden presentarse contracciones uterinas que no siempre indican que est en [place].  QU SON LAS CONTRACCIONES DE BRAXTON HICKS?  Las [employer] se presentan antes del Azari Janssens de parto se conocen como contracciones de [place]  de parto. Hacia el final del embarazo (32 a 34semanas), estas contracciones pueden aparecen con ms frecuencia y volverse ms intensas. No corresponden al [place] de parto verdadero porque estas contracciones no producen el agrandamiento (la dilatacin) y el afinamiento del cuello del tero. Algunas veces, es difcil distinguirlas del trabajo de parto verdadero porque en algunos casos pueden ser [employer], y las personas tienen diferentes niveles de tolerancia al [employer]. No debe sentirse avergonzada si concurre al hospital con falso trabajo de [place]. En ocasiones, la nica forma de saber si el trabajo de parto es verdadero es que el mdico determine si hay cambios en el cuello del tero. Si no hay problemas prenatales u otras complicaciones de salud asociadas con el embarazo, no habr  inconvenientes si la envan a su casa con falso trabajo de parto y espera que comience el verdadero. [place] DEL VERDADERO Falso trabajo de parto  Las contracciones del falso trabajo de parto duran menos y no son tan intensas como las verdaderas.  Generalmente son irregulares.  A menudo, se sienten en la parte delantera de la parte baja del abdomen y en la ingle,  y pueden desaparecer cuando camina o cambia de posicin mientras est acostada.  Las contracciones se vuelven ms dbiles y su duracin es Garment/textile technologist a medida que el tiempo transcurre.  Por lo general, no se hacen progresivamente ms intensas, regulares y Magazine features editor entre s como en el caso del [place] de parto verdadero. Antionette Fairy de parto  Las contracciones del verdadero trabajo de parto duran de 52 a 70segundos, son muy regulares y suelen volverse ms intensas, y [place] su frecuencia.  No desaparecen cuando camina.  La molestia generalmente se siente en la parte superior del tero y se extiende hacia la zona inferior del abdomen y [employer] cintura.  El mdico podr examinarla para determinar si el trabajo de parto es verdadero. El examen mostrar si el cuello del tero se est dilatando y [place]. LO QUE DEBE RECORDAR  [place] haciendo los ejercicios habituales y siga otras indicaciones que el mdico le d.  Tome todos los medicamentos como le indic el mdico.  Consulting civil engineer a las visitas prenatales regulares.  Coma y beba con moderacin si cree que est en trabajo de parto.  Si las contracciones de [employer] provocan incomodidad:  [place]ambie de posicin: si est acostada o descansando, camine; si est caminando, descanse.  Sintese y descanse en una baera con agua tibia.  Beba 2 o 3vasos de [place]. La deshidratacin puede provocar contracciones.  Respire lenta y profundamente varias veces por hora. [place]? Solicite atencin mdica  de inmediato si:  Las contracciones se intensifican, se hacen ms regulares y Industrial/product designer s.  Tiene una prdida de lquido por la vagina.  Tiene fiebre.  Elimina mucosidad manchada con [place].  Tiene una hemorragia vaginal abundante.  Tiene dolor abdominal permanente.  Tiene un dolor en la zona lumbar que nunca tuvo antes.  Siente que la cabeza del beb empuja hacia abajo y ejerce presin en la zona plvica.  El beb no se mueve Administrator, Civil Service. Document Released: 01/21/2005 Document Revised: 04/18/2013 [facility] Patient Information 2015 [place]. This information is not intended to replace advice given to you by your health care provider. Make sure you discuss any questions you have with your health care provider.

## 2014-04-13 NOTE — Progress Notes (Signed)
Patient was transfer to L&D , I assisted Systems analyst with some questions, by Juliann Mule, Interpreter

## 2014-04-13 NOTE — Progress Notes (Signed)
I was present during delivery with Hurshel Keys and Nira Conn, RN.  [place] Interpreter.
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
## 2014-04-13 NOTE — MAU Provider Note (Signed)
History  Chief Complaint:  Labor Eval  Shelby Morales is a 28 y.o. G2P1001 female at [redacted]w[redacted]d presenting with reports of contractions that occur every 3-4 mins since Wednesday (04/11/14).  Denies any vaginal bleeding or leaking of fluids.  States that she has not tried anything at home to attempt to alleviate the pain.  Reports active fetal movement. Denies uti s/s, abnormal/malodorous vag d/c or vulvovaginal itching/irritation.   Prenatal care at [facility] hospital [facility]).  Pregnancy complicated by varicose veins.  Obstetrical History: OB History    Gravida Para Term Preterm AB TAB SAB Ectopic Multiple Living   2 1 1       1       Past Medical History: Past Medical History  Diagnosis Date  . Medical history non-contributory     Past Surgical History: Past Surgical History  Procedure Laterality Date  . Appendectomy      Social History: History   Social History  . Marital Status: Married    Spouse Name: N/A    Number of Children: N/A  . Years of Education: N/A   Social History Main Topics  . Smoking status: Never Smoker   . Smokeless tobacco: Never Used  . Alcohol Use: No  . Drug Use: No  . Sexual Activity: Yes   Other Topics Concern  . None   Social History Narrative    Allergies: Allergies  Allergen Reactions  . Aspirin Itching    Pt told interpretor that she can take ibuprofen without probles  . Morphine And Related     Reaction  Heart pain    Prescriptions prior to admission  Medication Sig Dispense Refill Last Dose  . Prenatal Vit-Fe Fumarate-FA (PRENATAL MULTIVITAMIN) TABS tablet Take 1 tablet by mouth daily at 12 noon. 30 tablet 12 04/12/2014 at Unknown time  . cyclobenzaprine (FLEXERIL) 5 MG tablet Take 1 tablet (5 mg total) by mouth 3 (three) times daily as needed for muscle spasms. (Patient not taking: Reported on 04/04/2014) 30 tablet 0 Not Taking  . oxyCODONE-acetaminophen (PERCOCET/ROXICET) 5-325 MG per tablet Take 1 tablet by mouth every 4 (four)  hours as needed for moderate pain. (Patient not taking: Reported on 03/27/2014) 30 tablet 0 Not Taking    Review of Systems  Pertinent pos/neg as indicated in HPI  Physical Exam  Blood pressure 108/64, pulse 68, temperature 98.9 F (37.2 C), temperature source Oral, resp. rate 18, last menstrual period 07/14/2013, SpO2 99 %. General appearance: alert and mild distress Lungs: clear to auscultation bilaterally, normal effort Heart: regular rate and rhythm Abdomen: gravid, soft, non-tender Extremities: no edema    Dilation: 3 (2.5 - 3 cm) Effacement (%): 80 Cervical Position: Posterior Station: -2 Presentation: Vertex Exam by:: Alycia Rossetti  RN     Fetal monitoring: FHR: 140 bpm, variability: moderate,  Accelerations: Present,  decelerations:  Absent Uterine activity: irregular contractions every 7-12 mins.  Cervical exam unchanged after 1.5 hrs. Offered to recheck patient again, but she refused stating, "Why, the nurse already did it before you came in here".  MAU Course  Tylenol 650 mg po X 1.  Labs:  No results found for this or any previous visit (from the past 24 hour(s)).  Imaging:   Assessment and Plan  A:  [redacted]w[redacted]d SIUP  G2P1001  Braxton Hicks  Cat 1 FHR P:  Offer cervical exam again prior to discharge - pt refused  Discharge home  Reviewed labor precautions  Reviewed warning signs of when to return to hospital  Keep  next appt at [facility] as scheduled   [place] 12/18/20153:53 AM  I agree the above assessment. CRESENZO-DISHMAN,Alika Eppes 04/13/2014 7:36 AM

## 2014-04-13 NOTE — Progress Notes (Signed)
I assisted Benji RN with some questions about health history and allegies, By Juliann Mule Interpreter

## 2014-04-13 NOTE — Anesthesia Postprocedure Evaluation (Signed)
Anesthesia Post Note  Patient: Shelby Morales  Procedure(s) Performed: * No procedures listed *  Anesthesia type: Epidural  Patient location: Mother/Baby  Post pain: Pain level controlled  Post assessment: Post-op Vital signs reviewed  Last Vitals:  Filed Vitals:   04/13/14 1558  BP: 108/68  Pulse: 80  Temp: 36.9 C  Resp: 18    Post vital signs: Reviewed  Level of consciousness:alert  Complications: No apparent anesthesia complications

## 2014-04-13 NOTE — Progress Notes (Signed)
UR chart review completed.  

## 2014-04-13 NOTE — Progress Notes (Addendum)
I assisted Shelby Morales with explanation of plan of care, patients refuse to go home until she gets better . Later I asssited Shelby Morales with discharges paper work and instructions to go home, By [employer]

## 2014-04-13 NOTE — Lactation Note (Signed)
This note was copied from the chart of Shelby Sahmya [place]. Lactation Consultation Note Initial visit at 9 hours of age.  Mom reports a few good feedings and denies pain.  Baby has mouth up against breast, but not latched.  Mom reports baby finished a 15 minutes feeding.  Assisted with latch on right breast with cross cradle hold.  Encouraged mouth chin and cheeks to touch breast to get a deep wide latch.  Baby has strong sucking bursts with good jaw movement.  Mom does breast compressions and swallows are observed.  [facility] resources given and discussed.  Encouraged to feed with early cues on demand.  Early newborn behavior discussed.  Hand expression demonstrated with colostrum visible.  Mom to call for assist as needed.  Mom has visitor interpreting in [place] and hospital interpreter finished visit, mom denies concerns.       Patient Name: Shelby Morales [id number] Date: 04/13/2014 Reason for consult: Initial assessment;Other (Comment) (interpreter)   Maternal Data Has patient been taught Hand Expression?: Yes Does the patient have breastfeeding experience prior to this delivery?: Yes  Feeding Feeding Type: Breast Fed Length of feed:  (observed several minutes)  LATCH Score/Interventions Latch: Grasps breast easily, tongue down, lips flanged, rhythmical sucking.  Audible Swallowing: A few with stimulation Intervention(s): Hand expression Intervention(s): Skin to skin  Type of Nipple: Everted at rest and after stimulation  Comfort (Breast/Nipple): Soft / non-tender     Hold (Positioning): Assistance needed to correctly position infant at breast and maintain latch. Intervention(s): Breastfeeding basics reviewed;Support Pillows;Position options;Skin to skin  LATCH Score: 8  Lactation Tools Discussed/Used     Consult Status Consult Status: Follow-up Date: 04/14/14 Follow-up type: In-patient    Shoptaw, Justine Null 04/13/2014, 8:11 PM

## 2014-04-13 NOTE — Anesthesia Preprocedure Evaluation (Signed)

## 2014-04-13 NOTE — MAU Note (Signed)
Contractions every 2 min.  Blood tinge mucus.  No fluid seen.  Baby moving well.

## 2014-04-13 NOTE — Progress Notes (Signed)
MOB was referred for history of depression/anxiety.  Referral is screened out by Clinical Social Worker because none of the following criteria appear to apply: -History of anxiety/depression during this pregnancy, or of post-partum depression. - Diagnosis of anxiety and/or depression within last 3 years.   - History of depression due to pregnancy loss/loss of child or -MOB's symptoms are currently being treated with medication and/or therapy.  CSW completed chart review and noted that MOB received diagnosis of depression in 2009.     Please contact the Clinical Social Worker if needs arise or upon MOB request.

## 2014-04-13 NOTE — Progress Notes (Signed)
I stopped by to check on patient's needs, Nira Conn, RN will call me when is time for delivery. [place] Interpreter.

## 2014-04-13 NOTE — MAU Note (Signed)
PT  WAS D/C -  BUT ONLY  GOT  TO CAR- AND HUSBAND  CAME  BACK IN   AND  SAID SHE HURTS  TO  BAD-   SO PT BACK IN RM  7  -

## 2014-04-13 NOTE — Progress Notes (Signed)
Interpreter in to do safety and security information and fall precaution sheet. Admission paper work complete.  Patient's questions answered via interpreter.

## 2014-04-13 NOTE — Anesthesia Procedure Notes (Signed)
Epidural Patient location during procedure: OB  Preanesthetic Checklist Completed: patient identified, site marked, surgical consent, pre-op evaluation, timeout performed, IV checked, risks and benefits discussed and monitors and equipment checked  Epidural Patient position: sitting Prep: site prepped and draped and DuraPrep Patient monitoring: continuous pulse ox and blood pressure Approach: midline Location: L3-L4 Injection technique: LOR air  Needle:  Needle type: Tuohy  Needle gauge: 17 G Needle length: 9 cm and 9 Needle insertion depth: 5 cm cm Catheter type: closed end flexible Catheter size: 19 Gauge Catheter at skin depth: 10 cm Test dose: negative  Assessment Events: blood not aspirated, injection not painful, no injection resistance, negative IV test and no paresthesia  Additional Notes Dosing of Epidural:  1st dose, through catheter ............................................Marland Kitchen  Xylocaine 40 mg  2nd dose, through catheter, after waiting 3 minutes........Marland KitchenXylocaine 60 mg    ( 1% Xylo charted as a single dose in Epic Meds for ease of charting; actual dosing was fractionated as above, for saftey's sake)  As each dose occurred, patient was free of IV sx; and patient exhibited no evidence of SA injection.  Patient is more comfortable after epidural dosed. Please see RN's note for documentation of vital signs,and FHR which are stable.  Patient reminded not to try to ambulate with numb legs, and that an RN must be present when she attempts to get up.

## 2014-04-13 NOTE — Progress Notes (Signed)
Shelby Morales is a 28 y.o. G2P1001 at [redacted]w[redacted]d by ultrasound admitted for active labor  Subjective: Comfortable now. Denies feeling pressure.  Objective: BP 111/61 mmHg  Pulse 74  Temp(Src) 97.6 F (36.4 C) (Oral)  Resp 20  Ht 5\' 2"  (1.575 m)  Wt 150 lb (68.04 kg)  BMI 27.43 kg/m2  SpO2 100%  LMP 07/14/2013      FHT:  FHR: 145 bpm, variability: moderate,  accelerations:  Present,  decelerations:  Absent UC:   regular, every 3 minutes SVE:   Dilation: Lip/rim Effacement (%): 80 Station: 0 Exam by:: hk  Labs: Lab Results  Component Value Date   WBC 7.9 04/13/2014   HGB 11.1* 04/13/2014   HCT 34.9* 04/13/2014   MCV 76.7* 04/13/2014   PLT 179 04/13/2014    Assessment / Plan: Spontaneous labor, progressing normally  Labor: Progressing normally Preeclampsia:  n/a Fetal Wellbeing:  Category I Pain Control:  Epidural I/D:  n/a Anticipated MOD:  NSVD  Shelby Morales 04/13/2014, 9:25 AM

## 2014-04-14 LAB — CBC
HCT: 30.2 % — ABNORMAL LOW (ref 36.0–46.0)
Hemoglobin: 9.6 g/dL — ABNORMAL LOW (ref 12.0–15.0)
MCH: 24.6 pg — ABNORMAL LOW (ref 26.0–34.0)
MCHC: 31.8 g/dL (ref 30.0–36.0)
MCV: 77.2 fL — ABNORMAL LOW (ref 78.0–100.0)
PLATELETS: 150 10*3/uL (ref 150–400)
RBC: 3.91 MIL/uL (ref 3.87–5.11)
RDW: 16.6 % — AB (ref 11.5–15.5)
WBC: 9.2 10*3/uL (ref 4.0–10.5)

## 2014-04-14 NOTE — Progress Notes (Signed)
Post Partum Day 1 Subjective:  Shelby Morales is a 28 y.o. [id number] [redacted]w[redacted]d s/p SVD.  No acute events overnight.  Pt denies problems with ambulating, voiding or po intake.  She denies nausea or vomiting.  Pain is well controlled.   Lochia Small.  Plan for birth control is condoms.  Method of Feeding: breast  Objective: Blood pressure 111/76, pulse 68, temperature 98 F (36.7 C), temperature source Oral, resp. rate 18, height 5\' 2"  (1.575 m), weight 150 lb (68.04 kg), last menstrual period 07/14/2013, SpO2 99 %, unknown if currently breastfeeding.  Physical Exam:  General: alert, cooperative and no distress Lochia:normal flow Chest: CTAB Heart: RRR no m/r/g Abdomen: +BS, soft, nontender,  Uterine Fundus: firm DVT Evaluation: No evidence of DVT seen on physical exam. Extremities: no edema   Recent Labs  04/13/14 0535 04/14/14 0600  HGB 11.1* 9.6*  HCT 34.9* 30.2*    Assessment/Plan:  ASSESSMENT: Shelby Morales is a 28 y.o. [id number] [redacted]w[redacted]d s/p SVD  Plan for discharge tomorrow   LOS: 1 day   Tylon Kemmerling ROCIO 04/14/2014, 8:50 AM

## 2014-04-14 NOTE — Progress Notes (Signed)
Checked on patient needs.  Ordered patient snack and breakfast.  Spanish Interpreter - Waynard Edwards

## 2014-04-14 NOTE — Lactation Note (Addendum)
This note was copied from the chart of Shelby Morales. Lactation Consultation Note  Patient Name: Shelby Morales Today's Date: 04/14/2014   [facility] reviewed baby's feeding record and LATCH scores.  Mom is an experienced breastfeeding mom and baby has been nursing frequently for 10-20 minute feedings with output wnl. LATCH scores are consistently =8 per RN assessment.    Maternal Data    Feeding Feeding Type: Breast Fed  LATCH Score/Interventions              most recent LATCH score=8 per [facility] assessment        Lactation Tools Discussed/Used   N/A  Consult Status   Follow-up tomorrow   Bernita Buffy 04/14/2014, 9:41 PM

## 2014-04-14 NOTE — Progress Notes (Signed)
Checked on patient needs.  [place]

## 2014-04-14 NOTE — Progress Notes (Signed)
Discussed with interpreter about baby's previous spit up during the night and educated about feedings.  Will continue to monitor.

## 2014-04-14 NOTE — Progress Notes (Signed)
Assisted RN with interpretation of patient assessment Spanish Interpreter - Benita Laurin Coder

## 2014-04-14 NOTE — Progress Notes (Addendum)
I assisted Tia RN with some questions, patient is asking for blankets for th the baby and juice. Pierina is the RN for this patient now, by Juliann Mule Interpreter

## 2014-04-15 MED ORDER — OXYCODONE-ACETAMINOPHEN 5-325 MG PO TABS: 1.0000 | ORAL_TABLET | ORAL | Status: DC | PRN

## 2014-04-15 MED ORDER — IBUPROFEN 600 MG PO TABS: 600.0000 mg | ORAL_TABLET | Freq: Four times a day (QID) | ORAL | Status: DC | PRN

## 2014-04-15 NOTE — Lactation Note (Signed)
This note was copied from the chart of Shelby Willer [place]. Lactation Consultation Note   Mom reports that BF is going well.  I reviewed hand expression with her along with how to use the harmony hand pump.  Explained to her that her breasts should soften with feedings.  Mom is aware of outpatient services and support groups.  Denies any questions at this point. Patient Name: Shelby Morales Today's Date: 04/15/2014     Maternal Data    Feeding Feeding Type: Breast Fed Length of feed: 10 min  LATCH Score/Interventions Latch: Grasps breast easily, tongue down, lips flanged, rhythmical sucking.  Audible Swallowing: Spontaneous and intermittent Intervention(s): Skin to skin;Hand expression Intervention(s): Skin to skin;Hand expression  Type of Nipple: Everted at rest and after stimulation  Comfort (Breast/Nipple): Soft / non-tender     Hold (Positioning): No assistance needed to correctly position infant at breast. Intervention(s): Breastfeeding basics reviewed;Support Pillows;Position options;Skin to skin  LATCH Score: 10  Lactation Tools Discussed/Used     Consult Status      Van Clines 04/15/2014, 11:28 AM

## 2014-04-15 NOTE — Discharge Summary (Signed)
Obstetric Discharge Summary Reason for Admission: onset of labor Prenatal Procedures: ultrasound Intrapartum Procedures: spontaneous vaginal delivery Postpartum Procedures: none Complications-Operative and Postpartum: 2nd degree perineal laceration Eating, drinking, voiding, ambulating well.  +flatus.  Lochia and pain wnl.  Denies dizziness, lightheadedness, or sob. No complaints.   Hospital Course: Shelby Morales is a 28 y.o. G8P2002 female admited at 39.6wks w/ SOL. She progressed normally to uncomplicated SVB. Her pp course has been uncomplicated.  By PPD#2 she is doing well and is deemed to have received the full benefit of her hospital stay. She requests percocet rx.   Filed Vitals:   04/15/14 0500  BP: 113/74  Pulse: 75  Temp: 98.4 F (36.9 C)  Resp: 18   H/H: Lab Results  Component Value Date/Time   HGB 9.6* 04/14/2014 06:00 AM   HCT 30.2* 04/14/2014 06:00 AM    Physical Exam: General: alert, cooperative and no distress Abdomen/Uterine Fundus: Appropriately tender, non-distended, FF @ U-2 Incision: n/a Lochia: appropriate Extremities: No evidence of DVT seen on physical exam. Negative Homan's sign, no cords, calf tenderness, or significant calf/ankle edema   Discharge Diagnoses: Term Pregnancy-delivered  Discharge Information: Date: 11/06/2010 Activity: pelvic rest Diet: routine  Medications: PNV and Ibuprofen, percocet #10 Breast feeding: Yes Contraception: condoms Circumcision: n/a Condition: stable Instructions: refer to handout Discharge to: home  Infant: Home with mother    Tawnya Crook, CNM, WHNP-BC 04/15/2014,7:20 AM

## 2014-05-01 ENCOUNTER — Encounter (HOSPITAL_COMMUNITY): Payer: Self-pay | Admitting: *Deleted

## 2014-05-01 ENCOUNTER — Inpatient Hospital Stay (HOSPITAL_COMMUNITY)
Admission: AD | Admit: 2014-05-01 | Discharge: 2014-05-01 | Disposition: A | Discharge status: Home / Self Care | Payer: Self-pay | Source: Ambulatory Visit | Attending: Obstetrics & Gynecology | Admitting: Obstetrics & Gynecology

## 2014-05-01 DIAGNOSIS — O8620 Urinary tract infection following delivery, unspecified: Secondary | ICD-10-CM

## 2014-05-01 DIAGNOSIS — O9089 Other complications of the puerperium, not elsewhere classified: Secondary | ICD-10-CM

## 2014-05-01 DIAGNOSIS — R519 Headache, unspecified: Secondary | ICD-10-CM

## 2014-05-01 DIAGNOSIS — R51 Headache: Secondary | ICD-10-CM

## 2014-05-01 DIAGNOSIS — O9989 Other specified diseases and conditions complicating pregnancy, childbirth and the puerperium: Secondary | ICD-10-CM | POA: Insufficient documentation

## 2014-05-01 LAB — URINALYSIS, ROUTINE W REFLEX MICROSCOPIC
Bilirubin Urine: NEGATIVE
Glucose, UA: NEGATIVE mg/dL
Ketones, ur: NEGATIVE mg/dL
NITRITE: NEGATIVE
Protein, ur: NEGATIVE mg/dL
Urobilinogen, UA: 0.2 mg/dL (ref 0.0–1.0)
pH: 5.5 (ref 5.0–8.0)

## 2014-05-01 LAB — COMPREHENSIVE METABOLIC PANEL
ALT: 24 U/L (ref 0–35)
ANION GAP: 7 (ref 5–15)
AST: 21 U/L (ref 0–37)
Albumin: 4.1 g/dL (ref 3.5–5.2)
Alkaline Phosphatase: 108 U/L (ref 39–117)
BILIRUBIN TOTAL: 0.2 mg/dL — AB (ref 0.3–1.2)
BUN: 20 mg/dL (ref 6–23)
CHLORIDE: 105 meq/L (ref 96–112)
CO2: 27 mmol/L (ref 19–32)
CREATININE: 0.74 mg/dL (ref 0.50–1.10)
Calcium: 9.1 mg/dL (ref 8.4–10.5)
GFR calc Af Amer: >90 mL/min (ref >90)
Glucose, Bld: 83 mg/dL (ref 70–99)
Potassium: 4 mmol/L (ref 3.5–5.1)
Sodium: 139 mmol/L (ref 135–145)
Total Protein: 7.5 g/dL (ref 6.0–8.3)

## 2014-05-01 LAB — URINE MICROSCOPIC-ADD ON

## 2014-05-01 LAB — CBC
HCT: 38.9 % (ref 36.0–46.0)
Hemoglobin: 12.2 g/dL (ref 12.0–15.0)
MCH: 25 pg — ABNORMAL LOW (ref 26.0–34.0)
MCHC: 31.4 g/dL (ref 30.0–36.0)
MCV: 79.7 fL (ref 78.0–100.0)
PLATELETS: 245 10*3/uL (ref 150–400)
RBC: 4.88 MIL/uL (ref 3.87–5.11)
RDW: 17.7 % — ABNORMAL HIGH (ref 11.5–15.5)
WBC: 5.2 10*3/uL (ref 4.0–10.5)

## 2014-05-01 LAB — HCG, QUANTITATIVE, PREGNANCY: hCG, Beta Chain, Quant, S: 3 m[IU]/mL (ref <5)

## 2014-05-01 MED ORDER — PHENAZOPYRIDINE HCL 200 MG PO TABS: 200.0000 mg | ORAL_TABLET | Freq: Three times a day (TID) | ORAL | Status: DC | PRN

## 2014-05-01 MED ORDER — SULFAMETHOXAZOLE-TRIMETHOPRIM 800-160 MG PO TABS: 1.0000 | ORAL_TABLET | Freq: Two times a day (BID) | ORAL | Status: DC

## 2014-05-01 NOTE — Discharge Instructions (Signed)
Cuidados en el postparto luego de un parto vaginal  (Postpartum Care After Vaginal Delivery) Despus del parto (perodo de postparto), la estada normal en el hospital es de 24-72 horas. Si hubo problemas con el trabajo de parto o el parto, o si tiene otros problemas mdicos, es posible que Patent attorney en el hospital por ms [place].  Mientras est en el hospital, recibir [place] e instrucciones sobre cmo cuidar de usted misma y de su beb recin nacido durante el postparto.  Mientras est en el hospital:   Asegrese de decirle a las enfermeras si siente dolor o Tree surgeon, as como donde Designer, television/film set y Architect.  Si usted tuvo una incisin cerca de la vagina (episiotoma) o si ha tenido Education officer, museum, las enfermeras le pondrn hielo sobre la episiotoma o Psychiatrist. Las bolsas de hielo pueden ayudar a Dietitian y la hinchazn.  Si est amamantando, puede sentir contracciones dolorosas en el tero durante algunas semanas. Esto es normal. Las contracciones ayudan a que el tero vuelva a su tamao normal.  Es normal tener algo de sangrado despus del [place].  Durante los primeros 1-3 das despus del parto, el flujo es de color rojo y la cantidad puede ser similar a un perodo.  Es frecuente que el flujo se inicie y se Production assistant, radio.  En los primeros [place], puede eliminar algunos cogulos pequeos. Informe a las enfermeras si elimina cogulos grandes o aumenta el flujo.  No  elimine los cogulos de sangre por el inodoro antes de que la [employer] vea.  Durante los prximos 3 a [address] despus del parto, el flujo debe ser ms acuoso y rosado o Forensic psychologist.  Chancy Hurter a catorce [employer] del parto, el flujo debe ser una pequea cantidad de secrecin de color blanco amarillento.  La cantidad de flujo disminuir en las primeras semanas despus del parto. El flujo puede detenerse en 6-8 semanas. La mayora de las mujeres no tienen ms flujo a las 12 semanas  despus del [place].  Usted debe cambiar sus apsitos con frecuencia.  Lvese bien las manos con agua y jabn durante al menos 20 segundos despus de cambiar el apsito, usar el bao o antes de Nature conservation officer o Research scientist (life sciences) a su recin nacido.  Usted podr sentir como que tiene que vaciar la vejiga durante las primeras 6-8 horas despus del [place].  En caso de que sienta debilidad, mareo o [place], llame a la enfermera antes de levantarse de la cama por primera vez y antes de tomar una ducha por primera vez.  Dentro de los [employer] del parto, sus mamas pueden comenzar a estar sensibles y [place]. Esto se llama congestin. La sensibilidad en los senos por lo general desaparece dentro de las 48-72 horas despus de que ocurre la congestin. Tambin puede notar que la [place] se escapa de sus senos. Si no est amamantando no estimule sus pechos. La estimulacin de las mamas hace que sus senos produzcan ms [place].  Pasar tanto tiempo como le sea posible con el beb recin nacido es muy importante. Durante ese tiempo, usted y su beb deben sentirse cerca y conocerse uno al otro. Tener al beb en su habitacin (alojamiento conjunto) ayudar a fortalecer el vnculo con el beb recin nacido.Esto le dar tiempo para conocerlo y atenderlo de [employer] cmoda.  Las hormonas se modifican despus del parto. A veces, los cambios hormonales pueden causar tristeza o ganas de llorar por un tiempo. Estos sentimientos  no deben durar ms de [employer]. Si duran ms que eso, debe hablar con su mdico.  Si lo desea, hable con su mdico acerca de los mtodos de planificacin familiar o mtodos anticonceptivos.  Hable con su mdico acerca de las vacunas. El mdico puede indicarle que se aplique las siguientes vacunas antes de salir del hospital:  [place] contra el ttanos, la difteria y la tos ferina (Tdap) o el ttanos y la difteria (Td). Es muy importante que usted y su familia (incluyendo a los abuelos) u otras  personas que cuidan al recin nacido estn al da con las vacunas Tdap o Td. Las vacunas Tdap o Td pueden ayudar a proteger al recin nacido de enfermedades.  Inmunizacin contra la rubola.  Inmunizacin contra la varicela.  Inmunizacin contra la gripe. Usted debe recibir esta vacunacin anual si no la ha recibido Solicitor. Document Released: 02/08/2007 Document Revised: 01/06/2012 [facility] Patient Information 2015 [place]. This information is not intended to replace advice given to you by your health care provider. Make sure you discuss any questions you have with your health care provider.  Infeccin urinaria  (Urinary Tract Infection)  La infeccin urinaria puede ocurrir en Clinical cytogeneticist del tracto urinario. El tracto urinario es un sistema de drenaje del cuerpo por el que se eliminan los desechos y el exceso de [place]. El tracto urinario est formado por dos riones, dos urteres, la vejiga y Geologist, engineering. Los riones son rganos que tienen forma de frijol. Cada rin tiene aproximadamente el tamao del puo. Estn situados debajo de las [place], uno a cada lado de la columna vertebral CAUSAS  La causa de la infeccin son los microbios, que son organismos microscpicos, que incluyen hongos, virus, y bacterias. Estos organismos son tan pequeos que slo pueden verse a travs del microscopio. Las bacterias son los microorganismos que ms comnmente causan infecciones urinarias.  SNTOMAS  Los sntomas pueden variar segn la edad y el sexo del paciente y por la ubicacin de la infeccin. Los sntomas en las mujeres jvenes incluyen la necesidad frecuente e intensa de orinar y una sensacin dolorosa de ardor en la vejiga o en la uretra durante la miccin. Las mujeres y los hombres mayores podrn sentir cansancio, temblores y debilidad y Arts development officer musculares y Social research officer, government abdominal. Si tiene [place], puede significar que la infeccin est en los riones. Otros sntomas son dolor en  la espalda o en los lados debajo de las [place], nuseas y vmitos.  DIAGNSTICO  Para diagnosticar una infeccin urinaria, el mdico le preguntar acerca de sus sntomas. [employer] una [place]ita de [place]. La muestra de orina se analiza para Hydrographic surveyor bacterias y glbulos blancos de Herbalist. Los glbulos blancos se forman en el organismo para ayudar a Radio broadcast assistant las infecciones.  TRATAMIENTO  Por lo general, las infecciones urinarias pueden tratarse con medicamentos. Debido a que la [employer] de las infecciones son causadas por bacterias, por lo general pueden tratarse con antibiticos. La eleccin del antibitico y la duracin del tratamiento depender de sus sntomas y el tipo de bacteria causante de la infeccin.  INSTRUCCIONES PARA EL CUIDADO EN EL HOGAR   Si le recetaron antibiticos, tmelos exactamente como su mdico le indique. Termine el medicamento aunque se sienta mejor despus de haber tomado slo algunos.  Beba gran cantidad de lquido para mantener la orina de tono claro o color amarillo plido.  Evite la cafena, el t y las bebidas gaseosas. Estas sustancias irritan la vejiga.  Shanda Bumps  la vejiga con frecuencia. Evite retener la orina durante largos perodos.  Vace la vejiga antes y despus de Clinical biochemist.  Despus de mover el intestino, las mujeres deben higienizarse la regin perineal desde adelante hacia atrs. Use slo un papel tissue por vez. SOLICITE ATENCIN MDICA SI:   Siente dolor en la espalda.  Le sube la fiebre.  Los sntomas no mejoran luego de 3 das. SOLICITE ATENCIN MDICA DE INMEDIATO SI:   Siente dolor intenso en la espalda o en la zona inferior del abdomen.  Comienza a sentir escalofros.  Tiene nuseas o vmitos.  Tiene una sensacin continua de quemazn o molestias al [employer]. ASEGRESE DE QUE:   Comprende estas instrucciones.  Controlar su enfermedad.  Solicitar ayuda de inmediato si no mejora o empeora. Document  Released: 01/21/2005 Document Revised: 01/06/2012 [facility] Patient Information 2015 [place]. This information is not intended to replace advice given to you by your health care provider. Make sure you discuss any questions you have with your health care provider.

## 2014-05-01 NOTE — MAU Provider Note (Signed)
Chief Complaint: Vaginal Bleeding   First Provider Initiated Contact with Patient 05/01/14 1520     SUBJECTIVE HPI: Shelby Morales is a 29 y.o. [id number] at 2.5 weeks PP SVD on 04/13/14 who presents with vaginal bleeding. Stopped bleeding for a couple days, but bleeding has resumed and is constantly "dripping." One week ago, passed a clot. Uses 2-3 pads per day. States blood "smells bad." Denies any other discharge. Starting yesterday, burning sensation with urination. Denies pain or tenderness at laceration site.   Had 2nd degree perineal laceration.   Past Medical History  Diagnosis Date  . Medical history non-contributory    OB History  Gravida Para Term Preterm AB SAB TAB Ectopic Multiple Living  2 2 2       0 2    # Outcome Date GA Lbr Len/2nd Weight Sex Delivery Anes PTL Lv  2 Term 04/13/14 [redacted]w[redacted]d 04:20 / 00:37 3.72 kg (8 lb 3.2 oz) F Vag-Spont EPI  Y     Comments: WNL  1 Term 12/14/05    F Vag-Spont EPI  Y     Past Surgical History  Procedure Laterality Date  . Appendectomy     History   Social History  . Marital Status: Married    Spouse Name: N/A    Number of Children: N/A  . Years of Education: N/A   Occupational History  . Not on file.   Social History Main Topics  . Smoking status: Never Smoker   . Smokeless tobacco: Never Used  . Alcohol Use: No  . Drug Use: No  . Sexual Activity: Yes   Other Topics Concern  . Not on file   Social History Narrative   No current facility-administered medications on file prior to encounter.   Current Outpatient Prescriptions on File Prior to Encounter  Medication Sig Dispense Refill  . ibuprofen (ADVIL,MOTRIN) 600 MG tablet Take 1 tablet (600 mg total) by mouth every 6 (six) hours as needed for mild pain, moderate pain or cramping. 30 tablet 0  . oxyCODONE-acetaminophen (PERCOCET/ROXICET) 5-325 MG per tablet Take 1 tablet by mouth every 4 (four) hours as needed for moderate pain or severe pain. 10 tablet 0  . Prenatal  Vit-Fe Fumarate-FA (PRENATAL MULTIVITAMIN) TABS tablet Take 1 tablet by mouth daily at 12 noon. 30 tablet 12   Allergies  Allergen Reactions  . Aspirin Itching    Pt told interpretor that she can take ibuprofen without probles  . Morphine And Related     Reaction  Heart pain    ROS: Reports night sweats x 3 nights, fatigue, and weight loss. Denies fever, N/V. One week ago had 2 bouts of diarrhea followed by constipation; however, bowel habits have returned to normal. Headache in the front of head for last 3 days occurring in the middle of the day; experiences color flashes and dizziness. Denies fainting spells.    OBJECTIVE Blood pressure 126/70, pulse 67, temperature 99 F (37.2 C), temperature source Oral, resp. rate 18, currently breastfeeding. GENERAL: Well-developed, well-nourished female in no acute distress.  HEENT: Normocephalic HEART: normal rate RESP: normal effort ABDOMEN: Soft, non-tender. No CVAT. EXTREMITIES: Nontender, no edema NEURO: Alert and oriented SPECULUM EXAM: Perineum healing well. NT. Few sutures still in place. Small skin tag noted at perineum, physiologic discharge, dark red blood noted (enought to cover half a fox swab), cervix clean BIMANUAL: cervix closed; uterus normal size, no adnexal tenderness or masses  LAB RESULTS Results for orders placed or performed during the hospital encounter  of 05/01/14 (from the past 24 hour(s))  Urinalysis, Routine w reflex microscopic     Status: Abnormal   Collection Time: 05/01/14 11:15 AM  Result Value Ref Range   Color, Urine YELLOW YELLOW   APPearance CLEAR CLEAR   Specific Gravity, Urine >1.030 (H) 1.005 - 1.030   pH 5.5 5.0 - 8.0   Glucose, UA NEGATIVE NEGATIVE mg/dL   Hgb urine dipstick LARGE (A) NEGATIVE   Bilirubin Urine NEGATIVE NEGATIVE   Ketones, ur NEGATIVE NEGATIVE mg/dL   Protein, ur NEGATIVE NEGATIVE mg/dL   Urobilinogen, UA 0.2 0.0 - 1.0 mg/dL   Nitrite NEGATIVE NEGATIVE   Leukocytes, UA MODERATE  (A) NEGATIVE  Urine microscopic-add on     Status: Abnormal   Collection Time: 05/01/14 11:15 AM  Result Value Ref Range   Squamous Epithelial / LPF FEW (A) RARE   WBC, UA 3-6 <3 WBC/hpf   RBC / HPF 11-20 <3 RBC/hpf   Bacteria, UA RARE RARE   Urine-Other MUCOUS PRESENT   hCG, quantitative, pregnancy     Status: None   Collection Time: 05/01/14  3:10 PM  Result Value Ref Range   hCG, Beta Chain, Quant, S 3 <5 mIU/mL  CBC     Status: Abnormal   Collection Time: 05/01/14  3:10 PM  Result Value Ref Range   WBC 5.2 4.0 - 10.5 K/uL   RBC 4.88 3.87 - 5.11 MIL/uL   Hemoglobin 12.2 12.0 - 15.0 g/dL   HCT 38.9 36.0 - 46.0 %   MCV 79.7 78.0 - 100.0 fL   MCH 25.0 (L) 26.0 - 34.0 pg   MCHC 31.4 30.0 - 36.0 g/dL   RDW 17.7 (H) 11.5 - 15.5 %   Platelets 245 150 - 400 K/uL  Comprehensive metabolic panel     Status: Abnormal   Collection Time: 05/01/14  3:10 PM  Result Value Ref Range   Sodium 139 135 - 145 mmol/L   Potassium 4.0 3.5 - 5.1 mmol/L   Chloride 105 96 - 112 mEq/L   CO2 27 19 - 32 mmol/L   Glucose, Bld 83 70 - 99 mg/dL   BUN 20 6 - 23 mg/dL   Creatinine, Ser 0.74 0.50 - 1.10 mg/dL   Calcium 9.1 8.4 - 10.5 mg/dL   Total Protein 7.5 6.0 - 8.3 g/dL   Albumin 4.1 3.5 - 5.2 g/dL   AST 21 0 - 37 U/L   ALT 24 0 - 35 U/L   Alkaline Phosphatase 108 39 - 117 U/L   Total Bilirubin 0.2 (L) 0.3 - 1.2 mg/dL   GFR calc non Af Amer >90 >90 mL/min   GFR calc Af Amer >90 >90 mL/min   Anion gap 7 5 - 15    IMAGING No results found.  MAU COURSE Bleeding appropriate for 2.5 weeks PP. Very low concern for endometritis or retained POCs.   HA resolved w/ Ibuprofen.   ASSESSMENT 1. Postpartum UTI   2. Postpartum headache     PLAN Discharge home in stable condition. Lengthy conversation w/ pt via interpreter RE: normal recovery from vaginal delivery. Sx of complications reviewed. CNM concerned that pt is not eating, drinking, sleeping well. May be developing PP depression. Encouraged  good self-care.  Follow-up Information    Follow up with [facility] On 05/28/2014.   Specialty:  Obstetrics and Gynecology   Why:  As scheduled   Contact information:   [place] [phone]  Follow up with [place].   Why:  As needed if symptoms worsen   Contact information:   [address] [id number] [place] [phone]       Medication List    TAKE these medications        ibuprofen 600 MG tablet  Commonly known as:  ADVIL,MOTRIN  Take 1 tablet (600 mg total) by mouth every 6 (six) hours as needed for mild pain, moderate pain or cramping.     oxyCODONE-acetaminophen 5-325 MG per tablet  Commonly known as:  PERCOCET/ROXICET  Take 1 tablet by mouth every 4 (four) hours as needed for moderate pain or severe pain.     phenazopyridine 200 MG tablet  Commonly known as:  PYRIDIUM  Take 1 tablet (200 mg total) by mouth 3 (three) times daily as needed for pain.     prenatal multivitamin Tabs tablet  Take 1 tablet by mouth daily at 12 noon.     sulfamethoxazole-trimethoprim 800-160 MG per tablet  Commonly known as:  BACTRIM DS,SEPTRA DS  Take 1 tablet by mouth 2 (two) times daily.       [place], CNM 05/01/2014  5:14 PM

## 2014-05-01 NOTE — MAU Note (Signed)
Bled after delivery (12/18-vag), stopped for 2 days, then restarted and is heavier.(5days ago).  Today started having burning with urination.  Headache for 3 days. Has had chills at night.

## 2014-05-01 NOTE — MAU Note (Signed)
Bad headache for a few days, sees lights/flashes for the past wk

## 2014-05-02 LAB — URINE CULTURE
Colony Count: >=100000
Special Requests: NORMAL

## 2014-05-08 ENCOUNTER — Encounter: Payer: Self-pay | Admitting: General Practice

## 2014-05-28 ENCOUNTER — Ambulatory Visit: Payer: Self-pay | Admitting: Obstetrics & Gynecology

## 2014-06-01 ENCOUNTER — Ambulatory Visit: Payer: Self-pay | Admitting: Family

## 2014-06-04 ENCOUNTER — Ambulatory Visit: Payer: Self-pay | Admitting: Obstetrics and Gynecology

## 2014-06-14 ENCOUNTER — Ambulatory Visit: Payer: Self-pay | Admitting: Physician Assistant

## 2014-06-14 ENCOUNTER — Encounter: Payer: Self-pay | Admitting: Physician Assistant

## 2014-06-14 ENCOUNTER — Ambulatory Visit (INDEPENDENT_AMBULATORY_CARE_PROVIDER_SITE_OTHER): Payer: Self-pay | Admitting: Physician Assistant

## 2014-06-14 DIAGNOSIS — O924 Hypogalactia: Secondary | ICD-10-CM

## 2014-06-14 MED ORDER — METOCLOPRAMIDE HCL 10 MG PO TABS: 10.0000 mg | ORAL_TABLET | Freq: Three times a day (TID) | ORAL | Status: AC

## 2014-06-14 NOTE — Progress Notes (Signed)
Patient ID: Shelby Morales, female   DOB: 03-08-1986, 29 y.o.   MRN: [id number] History:  Shelby Morales is a 29 y.o. [id number] who presents to clinic today for postpartum visit.  She reports she was treated with Septra for UTI and noticed a significant decrease in milk production.  She is continuing to breast feed each time before giving a bottle to supplement but continues to notice low milk supply.  She also has had yellowish discharge x 3 days without odor or itching.  Denies vaginal bleeding, fever, weakness, dysuria.    The following portions of the patient's history were reviewed and updated as appropriate: allergies, current medications, past family history, past medical history, past social history, past surgical history and problem list.  Review of Systems:  Pertinent items are noted in HPI.  Objective:  Physical Exam BP 113/69 mmHg  Pulse 72  Wt 135 lb 12.8 oz (61.598 kg)  Breastfeeding? Yes GENERAL: Well-developed, well-nourished female in no acute distress.  HEENT: Normocephalic, atraumatic.  NECK: Supple. Normal thyroid.  LUNGS: Normal rate. Clear to auscultation bilaterally.  HEART: Regular rate and rhythm with no adventitious sounds.  BREASTS: Symmetric in size. No masses, skin changes, nipple drainage, or lymphadenopathy. ABDOMEN: Soft, nontender, nondistended. No organomegaly. Normal bowel sounds appreciated in all quadrants.  PELVIC: Normal external female genitalia.   Yellowish discharge - wet prep collected. Normal cervix contour.  Uterus is normal in size. No adnexal mass or tenderness.  EXTREMITIES: No cyanosis, clubbing, or edema, 2+ distal pulses.   Labs and Imaging No results found.  Assessment & Plan:  Assessment: Normal postpartum female with decreased milk supply and vaginal discharge  Plans: Discussed with Dr. Nehemiah Settle.  Will rx Reglan 10mg  TID to increase mild supply.  Sedation precautions given/side effects discussed.  Pt is also encouraged to call lactation  for further suggestions.  Condoms for contraception - RTC for other methods if decided.  Annual exam in 1 year.  Return sooner for breast/other problems  Shelby Stack, PA-C 06/14/2014 4:25 PM

## 2014-06-14 NOTE — Patient Instructions (Signed)
Cuidados en el postparto luego de un parto vaginal  (Postpartum Care After Vaginal Delivery) Despus del parto (perodo de postparto), la estada normal en el hospital es de 24-72 horas. Si hubo problemas con el trabajo de parto o el parto, o si tiene otros problemas mdicos, es posible que Patent attorney en el hospital por ms [place].  Mientras est en el hospital, recibir [place] e instrucciones sobre cmo cuidar de usted misma y de su beb recin nacido durante el postparto.  Mientras est en el hospital:   Asegrese de decirle a las enfermeras si siente dolor o Tree surgeon, as como donde Designer, television/film set y Architect.  Si usted tuvo una incisin cerca de la vagina (episiotoma) o si ha tenido Education officer, museum, las enfermeras le pondrn hielo sobre la episiotoma o Psychiatrist. Las bolsas de hielo pueden ayudar a Dietitian y la hinchazn.  Si est amamantando, puede sentir contracciones dolorosas en el tero durante algunas semanas. Esto es normal. Las contracciones ayudan a que el tero vuelva a su tamao normal.  Es normal tener algo de sangrado despus del [place].  Durante los primeros 1-3 das despus del parto, el flujo es de color rojo y la cantidad puede ser similar a un perodo.  Es frecuente que el flujo se inicie y se Production assistant, radio.  En los primeros [place], puede eliminar algunos cogulos pequeos. Informe a las enfermeras si elimina cogulos grandes o aumenta el flujo.  No  elimine los cogulos de sangre por el inodoro antes de que la [employer] vea.  Durante los prximos 3 a [address] despus del parto, el flujo debe ser ms acuoso y rosado o Forensic psychologist.  Chancy Hurter a catorce [employer] del parto, el flujo debe ser una pequea cantidad de secrecin de color blanco amarillento.  La cantidad de flujo disminuir en las primeras semanas despus del parto. El flujo puede detenerse en 6-8 semanas. La mayora de las mujeres no tienen ms flujo a las 12 semanas  despus del [place].  Usted debe cambiar sus apsitos con frecuencia.  Lvese bien las manos con agua y jabn durante al menos 20 segundos despus de cambiar el apsito, usar el bao o antes de Nature conservation officer o Research scientist (life sciences) a su recin nacido.  Usted podr sentir como que tiene que vaciar la vejiga durante las primeras 6-8 horas despus del [place].  En caso de que sienta debilidad, mareo o [place], llame a la enfermera antes de levantarse de la cama por primera vez y antes de tomar una ducha por primera vez.  Dentro de los [employer] del parto, sus mamas pueden comenzar a estar sensibles y [place]. Esto se llama congestin. La sensibilidad en los senos por lo general desaparece dentro de las 48-72 horas despus de que ocurre la congestin. Tambin puede notar que la [place] se escapa de sus senos. Si no est amamantando no estimule sus pechos. La estimulacin de las mamas hace que sus senos produzcan ms [place].  Pasar tanto tiempo como le sea posible con el beb recin nacido es muy importante. Durante ese tiempo, usted y su beb deben sentirse cerca y conocerse uno al otro. Tener al beb en su habitacin (alojamiento conjunto) ayudar a fortalecer el vnculo con el beb recin nacido.Esto le dar tiempo para conocerlo y atenderlo de [employer] cmoda.  Las hormonas se modifican despus del parto. A veces, los cambios hormonales pueden causar tristeza o ganas de llorar por un tiempo. Estos sentimientos  no deben durar ms de [employer]. Si duran ms que eso, debe hablar con su mdico.  Si lo desea, hable con su mdico acerca de los mtodos de planificacin familiar o mtodos anticonceptivos.  Hable con su mdico acerca de las vacunas. El mdico puede indicarle que se aplique las siguientes vacunas antes de salir del hospital:  [place] contra el ttanos, la difteria y la tos ferina (Tdap) o el ttanos y la difteria (Td). Es muy importante que usted y su familia (incluyendo a los abuelos) u otras  personas que cuidan al recin nacido estn al da con las vacunas Tdap o Td. Las vacunas Tdap o Td pueden ayudar a proteger al recin nacido de enfermedades.  Inmunizacin contra la rubola.  Inmunizacin contra la varicela.  Inmunizacin contra la gripe. Usted debe recibir esta vacunacin anual si no la ha recibido Solicitor. Document Released: 02/08/2007 Document Revised: 01/06/2012 [facility] Patient Information 2015 [place]. This information is not intended to replace advice given to you by your health care provider. Make sure you discuss any questions you have with your health care provider.

## 2014-06-14 NOTE — Progress Notes (Signed)
Patient ID: Shelby Morales, female   DOB: 10/15/1985, 29 y.o.   MRN: [id number] Pt feels her milk supply is low and would like medication to increase her milk supply.  Truitt Merle present as interpreter for encounter.

## 2014-06-15 LAB — WET PREP, GENITAL
Trich, Wet Prep: NONE SEEN
YEAST WET PREP: NONE SEEN

## 2014-09-12 ENCOUNTER — Ambulatory Visit (HOSPITAL_COMMUNITY): Payer: Medicaid Other

## 2014-09-20 ENCOUNTER — Ambulatory Visit (HOSPITAL_COMMUNITY): Admission: RE | Admit: 2014-09-20 | Payer: Medicaid Other | Source: Ambulatory Visit

## 2018-03-18 ENCOUNTER — Other Ambulatory Visit: Payer: Self-pay

## 2018-03-18 ENCOUNTER — Emergency Department (HOSPITAL_COMMUNITY)
Admission: EM | Admit: 2018-03-18 | Discharge: 2018-03-18 | Disposition: A | Discharge status: Home / Self Care | Payer: Self-pay | Attending: Emergency Medicine | Admitting: Emergency Medicine

## 2018-03-18 ENCOUNTER — Encounter (HOSPITAL_COMMUNITY): Payer: Self-pay | Admitting: *Deleted

## 2018-03-18 ENCOUNTER — Emergency Department (HOSPITAL_COMMUNITY): Payer: Self-pay

## 2018-03-18 DIAGNOSIS — F329 Major depressive disorder, single episode, unspecified: Secondary | ICD-10-CM | POA: Insufficient documentation

## 2018-03-18 DIAGNOSIS — G43909 Migraine, unspecified, not intractable, without status migrainosus: Secondary | ICD-10-CM | POA: Insufficient documentation

## 2018-03-18 DIAGNOSIS — Z79899 Other long term (current) drug therapy: Secondary | ICD-10-CM | POA: Insufficient documentation

## 2018-03-18 LAB — CBC
HEMATOCRIT: 32.8 % — AB (ref 36.0–46.0)
Hemoglobin: 10 g/dL — ABNORMAL LOW (ref 12.0–15.0)
MCH: 25.3 pg — ABNORMAL LOW (ref 26.0–34.0)
MCHC: 30.5 g/dL (ref 30.0–36.0)
MCV: 82.8 fL (ref 80.0–100.0)
Platelets: 186 10*3/uL (ref 150–400)
RBC: 3.96 MIL/uL (ref 3.87–5.11)
RDW: 14.9 % (ref 11.5–15.5)
WBC: 4.5 10*3/uL (ref 4.0–10.5)
nRBC: 0 % (ref 0.0–0.2)

## 2018-03-18 LAB — BASIC METABOLIC PANEL
Anion gap: 7 (ref 5–15)
BUN: 10 mg/dL (ref 6–20)
CO2: 25 mmol/L (ref 22–32)
Calcium: 8.8 mg/dL — ABNORMAL LOW (ref 8.9–10.3)
Chloride: 106 mmol/L (ref 98–111)
Creatinine, Ser: 0.66 mg/dL (ref 0.44–1.00)
GFR calc Af Amer: >60 mL/min (ref >60)
GFR calc non Af Amer: >60 mL/min (ref >60)
Glucose, Bld: 88 mg/dL (ref 70–99)
Potassium: 3.5 mmol/L (ref 3.5–5.1)
SODIUM: 138 mmol/L (ref 135–145)

## 2018-03-18 LAB — I-STAT BETA HCG BLOOD, ED (MC, WL, AP ONLY)

## 2018-03-18 MED ORDER — DIPHENHYDRAMINE HCL 50 MG/ML IJ SOLN
12.5000 mg | Freq: Once | INTRAMUSCULAR | Status: AC
  Administered 2018-03-18: 12.5 mg via INTRAVENOUS
  Filled 2018-03-18: qty 1

## 2018-03-18 MED ORDER — BUTALBITAL-APAP-CAFFEINE 50-325-40 MG PO TABS: 1.0000 | ORAL_TABLET | Freq: Four times a day (QID) | ORAL | 0 refills | Status: AC | PRN

## 2018-03-18 MED ORDER — PROCHLORPERAZINE EDISYLATE 10 MG/2ML IJ SOLN
10.0000 mg | Freq: Once | INTRAMUSCULAR | Status: AC
  Administered 2018-03-18: 10 mg via INTRAVENOUS
  Filled 2018-03-18: qty 2

## 2018-03-18 MED ORDER — SODIUM CHLORIDE 0.9 % IV BOLUS
1000.0000 mL | Freq: Once | INTRAVENOUS | Status: AC
  Administered 2018-03-18: 1000 mL via INTRAVENOUS

## 2018-03-18 NOTE — ED Notes (Signed)
Signature pad not working, pt unable to sign.

## 2018-03-18 NOTE — ED Notes (Signed)
Pt stable, ambulatory, and verbalizes understanding of d/c instructions.  

## 2018-03-18 NOTE — Discharge Instructions (Signed)
Take medications as prescribed, follow-up with your primary care doctor
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
## 2018-03-18 NOTE — ED Notes (Signed)
Pt waiting in d/c lounge to speak with EDP.  Translator at the bedside.

## 2018-03-18 NOTE — ED Triage Notes (Signed)
Pt was sent here from MD office for headache to right side and dizziness.  Pt appears pale in color and was noted to have hyperreflexia of patellar reflex. Concerned for issue of hemorrhage or embolus of artery.

## 2019-09-11 ENCOUNTER — Other Ambulatory Visit: Payer: Self-pay | Admitting: *Deleted

## 2019-09-11 DIAGNOSIS — O2200 Varicose veins of lower extremity in pregnancy, unspecified trimester: Secondary | ICD-10-CM

## 2019-09-11 DIAGNOSIS — M25569 Pain in unspecified knee: Secondary | ICD-10-CM

## 2019-09-13 ENCOUNTER — Encounter (HOSPITAL_COMMUNITY): Payer: Self-pay

## 2019-09-13 ENCOUNTER — Encounter: Payer: Self-pay | Admitting: Vascular Surgery

## 2019-09-18 ENCOUNTER — Encounter: Payer: Self-pay | Admitting: Surgery

## 2019-10-16 ENCOUNTER — Other Ambulatory Visit: Payer: Self-pay | Admitting: *Deleted

## 2019-10-16 DIAGNOSIS — I83893 Varicose veins of bilateral lower extremities with other complications: Secondary | ICD-10-CM

## 2019-10-26 ENCOUNTER — Encounter: Payer: Self-pay | Admitting: Vascular Surgery

## 2019-10-26 ENCOUNTER — Encounter (HOSPITAL_COMMUNITY): Payer: Self-pay

## 2021-11-06 ENCOUNTER — Other Ambulatory Visit: Payer: Self-pay

## 2021-11-06 ENCOUNTER — Emergency Department (HOSPITAL_COMMUNITY): Payer: Self-pay

## 2021-11-06 ENCOUNTER — Emergency Department (HOSPITAL_COMMUNITY)
Admission: EM | Admit: 2021-11-06 | Discharge: 2021-11-07 | Disposition: A | Discharge status: Home / Self Care | Payer: Self-pay | Attending: Emergency Medicine | Admitting: Emergency Medicine

## 2021-11-06 ENCOUNTER — Encounter (HOSPITAL_COMMUNITY): Payer: Self-pay | Admitting: Emergency Medicine

## 2021-11-06 DIAGNOSIS — T07XXXA Unspecified multiple injuries, initial encounter: Secondary | ICD-10-CM

## 2021-11-06 DIAGNOSIS — N9489 Other specified conditions associated with female genital organs and menstrual cycle: Secondary | ICD-10-CM | POA: Insufficient documentation

## 2021-11-06 DIAGNOSIS — R1011 Right upper quadrant pain: Secondary | ICD-10-CM | POA: Diagnosis not present

## 2021-11-06 DIAGNOSIS — Y92410 Unspecified street and highway as the place of occurrence of the external cause: Secondary | ICD-10-CM | POA: Diagnosis not present

## 2021-11-06 DIAGNOSIS — M545 Low back pain, unspecified: Secondary | ICD-10-CM | POA: Diagnosis not present

## 2021-11-06 DIAGNOSIS — S299XXA Unspecified injury of thorax, initial encounter: Secondary | ICD-10-CM | POA: Insufficient documentation

## 2021-11-06 LAB — CBC WITH DIFFERENTIAL/PLATELET
Abs Immature Granulocytes: 0.01 10*3/uL (ref 0.00–0.07)
Basophils Absolute: 0 10*3/uL (ref 0.0–0.1)
Basophils Relative: 1 %
Eosinophils Absolute: 0 10*3/uL (ref 0.0–0.5)
Eosinophils Relative: 1 %
HCT: 35.4 % — ABNORMAL LOW (ref 36.0–46.0)
Hemoglobin: 11 g/dL — ABNORMAL LOW (ref 12.0–15.0)
Immature Granulocytes: 0 %
Lymphocytes Relative: 38 %
Lymphs Abs: 2.1 10*3/uL (ref 0.7–4.0)
MCH: 25.2 pg — ABNORMAL LOW (ref 26.0–34.0)
MCHC: 31.1 g/dL (ref 30.0–36.0)
MCV: 81 fL (ref 80.0–100.0)
Monocytes Absolute: 0.4 10*3/uL (ref 0.1–1.0)
Monocytes Relative: 7 %
Neutro Abs: 3 10*3/uL (ref 1.7–7.7)
Neutrophils Relative %: 53 %
Platelets: 280 10*3/uL (ref 150–400)
RBC: 4.37 MIL/uL (ref 3.87–5.11)
RDW: 16 % — ABNORMAL HIGH (ref 11.5–15.5)
WBC: 5.5 10*3/uL (ref 4.0–10.5)
nRBC: 0 % (ref 0.0–0.2)

## 2021-11-06 LAB — I-STAT CHEM 8, ED
BUN: 12 mg/dL (ref 6–20)
Calcium, Ion: 1.19 mmol/L (ref 1.15–1.40)
Chloride: 104 mmol/L (ref 98–111)
Creatinine, Ser: 0.6 mg/dL (ref 0.44–1.00)
Glucose, Bld: 77 mg/dL (ref 70–99)
HCT: 37 % (ref 36.0–46.0)
Hemoglobin: 12.6 g/dL (ref 12.0–15.0)
Potassium: 3.6 mmol/L (ref 3.5–5.1)
Sodium: 140 mmol/L (ref 135–145)
TCO2: 22 mmol/L (ref 22–32)

## 2021-11-06 LAB — I-STAT BETA HCG BLOOD, ED (MC, WL, AP ONLY): I-stat hCG, quantitative: <5 m[IU]/mL (ref <5)

## 2021-11-06 MED ORDER — IOHEXOL 300 MG/ML  SOLN
80.0000 mL | Freq: Once | INTRAMUSCULAR | Status: AC | PRN
  Administered 2021-11-06: 80 mL via INTRAVENOUS

## 2021-11-06 MED ORDER — ONDANSETRON 4 MG PO TBDP
4.0000 mg | ORAL_TABLET | Freq: Once | ORAL | Status: AC
  Administered 2021-11-06: 4 mg via ORAL
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
## 2021-11-06 NOTE — ED Notes (Signed)
This RN went to mini lab to question why patient's ordered I-stat had not resulted although specimen had been obtained and placed in mini lab. Specimen found to be sitting on the counter and had not been run at this time. This RN asked ED tech to run I-stat at this time due to patient waiting on CT scan.
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
## 2021-11-07 ENCOUNTER — Encounter (HOSPITAL_COMMUNITY): Payer: Self-pay | Admitting: *Deleted

## 2021-11-07 NOTE — ED Provider Notes (Signed)
CT imaging is negative for any blunt chest or abdominal trauma.  Patient is awake alert, resting comfortably sitting in a chair.  She will be discharged home.  Advised OTC meds for pain control   Ripley Fraise, MD 11/07/21 [phone]

## 2021-11-26 ENCOUNTER — Other Ambulatory Visit: Payer: Self-pay | Admitting: Chiropractic Medicine

## 2021-11-26 DIAGNOSIS — M545 Low back pain, unspecified: Secondary | ICD-10-CM

## 2021-12-10 ENCOUNTER — Ambulatory Visit
Admission: RE | Admit: 2021-12-10 | Discharge: 2021-12-10 | Disposition: A | Discharge status: Home / Self Care | Payer: No Typology Code available for payment source | Source: Ambulatory Visit | Attending: Chiropractic Medicine | Admitting: Chiropractic Medicine

## 2021-12-10 DIAGNOSIS — M545 Low back pain, unspecified: Secondary | ICD-10-CM
